# Patient Record
Sex: Female | Born: 1970 | Race: White | Hispanic: No | Smoking: Current every day smoker
Health system: Southern US, Community
[De-identification: ages and names within clinical notes are randomized; demographics above are authoritative.]

## PROBLEM LIST (undated history)

## (undated) DIAGNOSIS — K644 Residual hemorrhoidal skin tags: Secondary | ICD-10-CM

## (undated) DIAGNOSIS — B009 Herpesviral infection, unspecified: Secondary | ICD-10-CM

## (undated) DIAGNOSIS — T7840XA Allergy, unspecified, initial encounter: Secondary | ICD-10-CM

## (undated) DIAGNOSIS — F419 Anxiety disorder, unspecified: Secondary | ICD-10-CM

## (undated) HISTORY — PX: ABLATION: SHX5711

## (undated) HISTORY — DX: Herpesviral infection, unspecified: B00.9

## (undated) HISTORY — DX: Residual hemorrhoidal skin tags: K64.4

## (undated) HISTORY — DX: Allergy, unspecified, initial encounter: T78.40XA

## (undated) HISTORY — DX: Anxiety disorder, unspecified: F41.9

---

## 1999-02-20 ENCOUNTER — Other Ambulatory Visit: Admission: RE | Admit: 1999-02-20 | Discharge: 1999-02-20 | Payer: Self-pay | Admitting: *Deleted

## 2001-01-02 ENCOUNTER — Emergency Department (HOSPITAL_COMMUNITY): Admission: EM | Admit: 2001-01-02 | Discharge: 2001-01-03 | Payer: Self-pay | Admitting: Emergency Medicine

## 2003-03-01 ENCOUNTER — Other Ambulatory Visit: Admission: RE | Admit: 2003-03-01 | Discharge: 2003-03-01 | Payer: Self-pay | Admitting: *Deleted

## 2006-06-04 ENCOUNTER — Ambulatory Visit: Payer: Self-pay | Admitting: Obstetrics & Gynecology

## 2006-06-04 ENCOUNTER — Encounter: Payer: Self-pay | Admitting: Obstetrics & Gynecology

## 2006-06-04 ENCOUNTER — Encounter (INDEPENDENT_AMBULATORY_CARE_PROVIDER_SITE_OTHER): Payer: Self-pay | Admitting: Specialist

## 2007-02-04 ENCOUNTER — Ambulatory Visit: Payer: Self-pay | Admitting: Obstetrics & Gynecology

## 2008-09-08 ENCOUNTER — Inpatient Hospital Stay (HOSPITAL_COMMUNITY): Admission: EM | Admit: 2008-09-08 | Discharge: 2008-09-09 | Payer: Self-pay | Admitting: Emergency Medicine
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
## 2010-07-23 LAB — POCT I-STAT, CHEM 8
Calcium, Ion: 1.08 mmol/L — ABNORMAL LOW (ref 1.12–1.32)
Creatinine, Ser: 0.9 mg/dL (ref 0.4–1.2)
Glucose, Bld: 103 mg/dL — ABNORMAL HIGH (ref 70–99)
Hemoglobin: 16.3 g/dL — ABNORMAL HIGH (ref 12.0–15.0)
Sodium: 139 mEq/L (ref 135–145)
TCO2: 21 mmol/L (ref 0–100)

## 2010-07-23 LAB — CBC
HCT: 41 % (ref 36.0–46.0)
HCT: 45.9 % (ref 36.0–46.0)
Hemoglobin: 13.9 g/dL (ref 12.0–15.0)
MCV: 93.5 fL (ref 78.0–100.0)
MCV: 93.7 fL (ref 78.0–100.0)
Platelets: 195 10*3/uL (ref 150–400)
RBC: 4.38 MIL/uL (ref 3.87–5.11)
WBC: 10.3 10*3/uL (ref 4.0–10.5)
WBC: 8.6 10*3/uL (ref 4.0–10.5)

## 2010-07-23 LAB — URINALYSIS, ROUTINE W REFLEX MICROSCOPIC
Bilirubin Urine: NEGATIVE
Ketones, ur: NEGATIVE mg/dL
Protein, ur: NEGATIVE mg/dL
Urobilinogen, UA: 1 mg/dL (ref 0.0–1.0)

## 2010-07-23 LAB — DIFFERENTIAL
Eosinophils Absolute: 0.1 10*3/uL (ref 0.0–0.7)
Eosinophils Absolute: 0.1 10*3/uL (ref 0.0–0.7)
Eosinophils Relative: 1 % (ref 0–5)
Eosinophils Relative: 1 % (ref 0–5)
Lymphocytes Relative: 24 % (ref 12–46)
Lymphs Abs: 1.8 10*3/uL (ref 0.7–4.0)
Lymphs Abs: 2 10*3/uL (ref 0.7–4.0)
Monocytes Relative: 7 % (ref 3–12)
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
## 2010-08-30 NOTE — Assessment & Plan Note (Signed)
NAME:  Meredith Figueroa, Meredith Figueroa               ACCOUNT NO.:  [id number]   MEDICAL RECORD NO.:  [id number]          PATIENT TYPE:  POB   LOCATION:  [facility]         FACILITY:  [facility]   PHYSICIAN:  Elsie Lincoln, MD      DATE OF BIRTH:  1970-11-08   DATE OF SERVICE:                                  CLINIC NOTE   The patient is a 40 year old G1, P62, 16 female who presents for physical  examination and also has complaints of pain with sex and a white  discharge with a foul odor.  She had been getting care in the area with  another gynecologist and is transferring care to [place].  Her main complaint  today is the discharge and irritation and pain that she has with  intercourse secondary, hopefully, to the discharge.  She is not using  contraceptives other than coitus interruptus.  We talked about wearing  an IUD today, which would help her dysmenorrhea, her menstrual flow, and  also provide better contraception.  She had been on pills in the past  but self-discontinued because she is a smoker and over 33.   PAST MEDICAL HISTORY:  Denies all medical problems.   PAST SURGICAL HISTORY:  Tonsillectomy as a child.   PAST GYNECOLOGICAL HISTORY:  NSVD times 1 nine years ago.  Menarche at  age 3 with 23 day cycles with periods that lasts 5 days.  No bleeding  between periods.  She had an abnormal Pap smear in 1990 but was  secondary to warts and she had a repeat pap.  She has had a history of  warts in the past just as I said before.   FAMILY HISTORY:  Grandmother has diabetes, grandfather has heart  disease.  Grandparents and mother have high blood pressure.  Her father  has throat cancer.  Her paternal grandmother died of ovarian cancer.  She does not know any other of her paternal history.  No history of  blood clots.   SOCIAL HISTORY:  She smokes.  Does not drink alcohol.  She does have six  caffeinated beverages a day.  No history of sexual abuse.   REVIEW OF SYMPTOMS:  Positive for  muscle aches, fatigue, weight gain,  frequent headaches, loss of urine with coughing, sneezing, vaginal odor  and pain with intercourse.  We will be addressing the painful  intercourse and yeast infection today.   PHYSICAL EXAMINATION:  Pulse 75, blood pressure 107/77, weight 207  pounds.  GENERAL:  Well-developed, well-nourished, in no apparent distress.  HEENT: Normocephalic, atraumatic.  Neck no masses.  LUNGS:  Clear to auscultation bilaterally.  HEART:  Regular rate and rhythm.  BREASTS:  Symmetric, no masses, no nipple discharge.  ABDOMEN:  Soft nontender, nondistended.  No hernia, No organomegaly.  Patient has multiple skin lesions on her trunk and one is of particular  concern to the right of the umbilicus.  This needs a dermatology  referral.  GENITALIA:  Vagina slightly injected with discharge consistent with  yeast.  Uterus anteverted, non-tender.  Adnexa: No masses, non-tender.  Urethra:  Well supported, nontender.  Bladder:  Non-tender.  Perineum  intact.  EXTREMITIES: No edema.  ASSESSMENT/PLAN:  A 40 year old female for gynecological examination.  1. Pap smear and cultures.  2. Wet prep showed yeast.  3. Prescribe  Diflucan.  4. Dermatology referral.  5. Patient to get a primary care Andon Villard for all other primary care      needs.  6. Patient to come back if still having discharge symptoms or would      like to try the IUD.           ______________________________  Elsie Lincoln, MD     KL/MEDQ  D:  06/04/2006  T:  06/04/2006  Job:  [id number]

## 2011-03-22 ENCOUNTER — Ambulatory Visit (INDEPENDENT_AMBULATORY_CARE_PROVIDER_SITE_OTHER): Payer: Managed Care, Other (non HMO)

## 2011-03-22 DIAGNOSIS — Z23 Encounter for immunization: Secondary | ICD-10-CM

## 2011-07-12 ENCOUNTER — Ambulatory Visit: Payer: Managed Care, Other (non HMO)

## 2011-08-13 HISTORY — PX: TUBAL LIGATION: SHX77

## 2012-01-08 ENCOUNTER — Ambulatory Visit (INDEPENDENT_AMBULATORY_CARE_PROVIDER_SITE_OTHER): Payer: Managed Care, Other (non HMO) | Admitting: Physician Assistant

## 2012-01-08 VITALS — BP 116/62 | HR 83 | Temp 98.7°F | Resp 18 | Ht 69.5 in | Wt 203.2 lb

## 2012-01-08 DIAGNOSIS — B009 Herpesviral infection, unspecified: Secondary | ICD-10-CM

## 2012-01-08 DIAGNOSIS — B001 Herpesviral vesicular dermatitis: Secondary | ICD-10-CM

## 2012-01-08 MED ORDER — CHLORHEXIDINE GLUCONATE 0.12 % MT SOLN: 15.0000 mL | Freq: Two times a day (BID) | OROMUCOSAL | Status: DC

## 2012-01-08 MED ORDER — VALACYCLOVIR HCL 1 G PO TABS: ORAL_TABLET | ORAL | Status: DC

## 2012-01-08 NOTE — Patient Instructions (Signed)
Keep the lips moisturized (I like the Clinique SuperBalm) to allow the lesions to heal and for your comfort.  You may find that taking ibuprofen also helps, since it reduces inflammation. Good dental care can prevent secondary infection (brush 2-3 times daily and floss at least once daily).

## 2012-01-08 NOTE — Progress Notes (Signed)
  Subjective:    Patient ID: Meredith Figueroa, female    DOB: 1970-07-16, 41 y.o.   MRN: [id number]  HPI This 41 y.o. female presents for evaluation of a cluster of fever blisters that began 01/03/2012.  Didn't begin treatment with Abreva until 01/04/2012, but hasn't had improvement this time, and has gotten larger than usual.  Lesions are triggered by stress and intense heat ("like at the beach.").  Lots of work stress this week.  Describes pain from the lip into the gum and teeth adjacent.  Review of Systems  No recent URI-type illness.  Past Medical History  Diagnosis Date  . HSV infection     twice annually; mouth  . Allergy     Past Surgical History  Procedure Date  . Tubal ligation 08/2011    Prior to Admission medications   Not on File    Not on File  History   Social History  . Marital Status: Married    Spouse Name: Denija Vantassel    Number of Children: 1  . Years of Education: 14   Occupational History  . Marine scientist    Social History Main Topics  . Smoking status: Current Every Day Smoker -- 1.0 packs/day    Types: Cigarettes    Start date: 01/07/1986  . Smokeless tobacco: Never Used  . Alcohol Use: Yes     occasional  . Drug Use: No  . Sexually Active: Yes -- Female partner(s)    Birth Control/ Protection: Surgical   Other Topics Concern  . Not on file   Social History Narrative  . No narrative on file    Family History  Problem Relation Age of Onset  . Diabetes Mother   . Arthritis Mother   . Heart disease Mother   . Cancer Father        Objective:   Physical Exam  Vitals reviewed. Constitutional: She is oriented to person, place, and time. Vital signs are normal. She appears well-developed and well-nourished. She is active and cooperative. No distress.  HENT:  Head: Normocephalic and atraumatic.  Mouth/Throat: Uvula is midline. Abnormal dentition: heavy plaque build up, mild gingival erythema.    Eyes: Conjunctivae normal are  normal.  Neck: Normal range of motion and phonation normal. Neck supple. No thyromegaly present.  Cardiovascular: Normal rate, regular rhythm and normal heart sounds.   Pulmonary/Chest: Effort normal and breath sounds normal.  Lymphadenopathy:    She has no cervical adenopathy.  Neurological: She is alert and oriented to person, place, and time.  Skin: Skin is warm and dry.  Psychiatric: She has a normal mood and affect.          Assessment & Plan:

## 2012-05-14 ENCOUNTER — Ambulatory Visit (INDEPENDENT_AMBULATORY_CARE_PROVIDER_SITE_OTHER): Payer: Managed Care, Other (non HMO) | Admitting: Family Medicine

## 2012-05-14 VITALS — BP 130/77 | HR 66 | Temp 98.1°F | Resp 16 | Ht 69.5 in | Wt 202.0 lb

## 2012-05-14 DIAGNOSIS — Z113 Encounter for screening for infections with a predominantly sexual mode of transmission: Secondary | ICD-10-CM

## 2012-05-14 DIAGNOSIS — R300 Dysuria: Secondary | ICD-10-CM

## 2012-05-14 LAB — POCT URINALYSIS DIPSTICK
Ketones, UA: NEGATIVE
Leukocytes, UA: NEGATIVE
Protein, UA: NEGATIVE
pH, UA: 7

## 2012-05-14 LAB — POCT UA - MICROSCOPIC ONLY
Crystals, Ur, HPF, POC: NEGATIVE
Mucus, UA: NEGATIVE
Yeast, UA: NEGATIVE

## 2012-05-14 NOTE — Patient Instructions (Addendum)
I will be in touch with the rest of your labs. Let us know if you continue to be concerned about any bumps and we can check again

## 2012-05-14 NOTE — Progress Notes (Signed)
[facility] [address], [place] [address] [phone]  Date:  05/14/2012   Name:  Meredith Figueroa   DOB:  Jul 27, 1970   MRN:  [id number]  PCP:  No primary provider on file.    Chief Complaint: Exposure to STD   History of Present Illness:  Meredith Figueroa is a 42 y.o. very pleasant female patient who presents with the following:  She was diagnosed with warts in her genital area as a teen. She has recently noted non- tender bumps in her vaginal area and wanted to be sure everything was ok.   Othewise she is feeling well.  No current dysuria, but she noted UTI symptoms as of late.  She use cipro a few weeks ago, but still notes that her urine seems cloudy.    She had an endometrial ablation and does not get menses.    Otherwise she is generally healthy.  She does tend to get cold sore, but no history of genital HSV  Patient Active Problem List  Diagnosis  . HSV infection    Past Medical History  Diagnosis Date  . HSV infection     twice annually; mouth  . Allergy     Past Surgical History  Procedure Date  . Tubal ligation 08/2011    History  Substance Use Topics  . Smoking status: Current Every Day Smoker -- 1.0 packs/day    Types: Cigarettes    Start date: 01/07/1986  . Smokeless tobacco: Never Used  . Alcohol Use: Yes     Comment: occasional    Family History  Problem Relation Age of Onset  . Diabetes Mother   . Arthritis Mother   . Heart disease Mother   . Cancer Father     No Known Allergies  Medication list has been reviewed and updated.  Current Outpatient Prescriptions on File Prior to Visit  Medication Sig Dispense Refill  . valACYclovir (VALTREX) 1000 MG tablet Take 2 tablets by mouth every 12 hours x 2 doses, prn fever blister  30 tablet  1    Review of Systems:  As per HPI- otherwise negative.   Physical Examination: Filed Vitals:   05/14/12 1100  BP: 130/77  Pulse: 66  Temp: 98.1 F (36.7 C)  Resp: 16    Filed Vitals:   05/14/12 1100  Height: 5' 9.5" (1.765 m)  Weight: 202 lb (91.627 kg)   Body mass index is 29.40 kg/(m^2). Ideal Body Weight: Weight in (lb) to have BMI = 25: 171.4   GEN: WDWN, NAD, Non-toxic, A & O x 3 HEENT: Atraumatic, Normocephalic. Neck supple. No masses, No LAD. Bilateral TM wnl, oropharynx normal.  PEERL,EOMI.   Ears and Nose: No external deformity. CV: RRR, No M/G/R. No JVD. No thrill. No extra heart sounds. PULM: CTA B, no wheezes, crackles, rhonchi. No retractions. No resp. distress. No accessory muscle use. EXTR: No c/c/e NEURO Normal gait.  PSYCH: Normally interactive. Conversant. Not depressed or anxious appearing.  Calm demeanor.  GU: no abnormality noted, normal skin and remnant of hymenal ring.  I do not see any apparent warts.  Did genprobe per her request  Results for orders placed in visit on 05/14/12  POCT UA - MICROSCOPIC ONLY      Component Value Range   WBC, Ur, HPF, POC 0-1     RBC, urine, microscopic 1-3     Bacteria, U Microscopic neg     Mucus, UA neg  Epithelial cells, urine per micros 3-6     Crystals, Ur, HPF, POC neg     Casts, Ur, LPF, POC neg     Yeast, UA neg    POCT URINALYSIS DIPSTICK      Component Value Range   Color, UA yellow     Clarity, UA clear     Glucose, UA neg     Bilirubin, UA neg     Ketones, UA neg     Spec Grav, UA 1.010     Blood, UA small     pH, UA 7.0     Protein, UA neg     Urobilinogen, UA 0.2     Nitrite, UA neg     Leukocytes, UA Negative      Assessment and Plan: 1. Routine screening for STI (sexually transmitted infection)  GC/chlamydia probe amp, genital  2. Dysuria  POCT UA - Microscopic Only, POCT urinalysis dipstick, Urine culture   Await labs as above.  reassurance that I do not see any apparent warts, but she will let us know if she continues to notice anything out of the ordinary   Inice Sanluis, MD

## 2012-05-15 LAB — GC/CHLAMYDIA PROBE AMP
CT Probe RNA: NEGATIVE
GC Probe RNA: NEGATIVE

## 2012-11-17 ENCOUNTER — Ambulatory Visit (INDEPENDENT_AMBULATORY_CARE_PROVIDER_SITE_OTHER): Payer: Managed Care, Other (non HMO) | Admitting: Family Medicine

## 2012-11-17 VITALS — BP 120/72 | HR 86 | Temp 98.0°F | Resp 18 | Ht 69.5 in | Wt 204.0 lb

## 2012-11-17 DIAGNOSIS — R198 Other specified symptoms and signs involving the digestive system and abdomen: Secondary | ICD-10-CM

## 2012-11-17 DIAGNOSIS — F411 Generalized anxiety disorder: Secondary | ICD-10-CM

## 2012-11-17 DIAGNOSIS — B009 Herpesviral infection, unspecified: Secondary | ICD-10-CM

## 2012-11-17 DIAGNOSIS — R3989 Other symptoms and signs involving the genitourinary system: Secondary | ICD-10-CM

## 2012-11-17 MED ORDER — ALPRAZOLAM 0.5 MG PO TABS: 0.5000 mg | ORAL_TABLET | Freq: Every evening | ORAL | Status: DC | PRN

## 2012-11-17 MED ORDER — DOXYCYCLINE HYCLATE 100 MG PO TABS: 100.0000 mg | ORAL_TABLET | Freq: Two times a day (BID) | ORAL | Status: DC

## 2012-11-17 NOTE — Patient Instructions (Addendum)
You can take the valtrex twice per day for next 1 week - until results of tests today. Apply warm compresses or soaks 3-4 times per day., start antibiotic 2 times per day, recheck in next 2-3 days if not much improved. Return to the clinic or go to the nearest emergency room if any of your symptoms worsen or new symptoms occur.  Xanax if needed up to twice per day, but follow up to discuss your symptoms further with your primary provider, or here if frequent use needed.  Let us know if you would like a name of a counselor/therapist.

## 2012-11-17 NOTE — Progress Notes (Signed)
Subjective:    Patient ID: Meredith Figueroa, female    DOB: 08/13/1970, 41 y.o.   MRN: [id number]  HPI Meredith Figueroa is a 42 y.o. female  Cold sore - noticed on lower left lip Friday am - 5 days ago. Did start Valtrex 2 grams and repeat in 12 hours. Also used Abbreva. More stress recently. Flairs once per year. Going through separation.  Noticed bump in genital area this am - concerned may have spread. Sore area. No vaginal discharge. No new sexual partners. No tx. No prior similar sx's.   Novosure and tubal ligation last year- no further menstrual bleeding.   Going through separation  - past 1 month. Used Effexor in past, but not needed recently. Anxious at times, and xanax used for sleep prior.    Review of Systems  Constitutional: Negative for fever and chills.  Genitourinary: Positive for genital sores. Negative for dysuria, urgency, hematuria, vaginal discharge and difficulty urinating.  Psychiatric/Behavioral: Negative for suicidal ideas.       Objective:   Physical Exam  Constitutional: She appears well-developed and well-nourished. No distress.  HENT:  Head: Normocephalic and atraumatic.  Mouth/Throat: Uvula is midline.    Pulmonary/Chest: Effort normal.  Genitourinary: Vagina normal.    There is lesion on the left labia. No tenderness around the vagina. No vaginal discharge found.  Psychiatric: Her speech is normal and behavior is normal. Judgment and thought content normal. Cognition and memory are normal. She expresses no suicidal ideation.  Tearful at time during exam.  Flat affect.           Assessment & Plan:  Meredith Figueroa is a 42 y.o. female HSV-1 (herpes simplex virus 1) infection - Plan: Herpes simplex virus culture, HSV(herpes simplex vrs) 1+2 ab-IgG.  Genital sore - Plan: Herpes simplex virus culture, HSV(herpes simplex vrs) 1+2 ab-IgG, doxycycline (VIBRA-TABS) 100 MG tablet -  Appears more cystic vs early abcess, less likely HSV, but with current  flair of HSV 1 will cover with valtrex 1 gram BID for 7 days until cx and HSV IGG results return.  Additionally cover for infection with doxycycline, warm compresses, RTC for recheck in next 2-3 days.   Anxiety state, unspecified - Plan: ALPRAZolam (XANAX) 0.5 MG tablet.  Adjustment disorder with current separation. Will rx temporary xanax, but consider restart effexor if persistent sx''s.   rtc precautions.   Meds ordered this encounter  Medications  . ALPRAZolam (XANAX) 0.5 MG tablet    Sig: Take 1 tablet (0.5 mg total) by mouth at bedtime as needed for sleep.    Dispense:  15 tablet    Refill:  0  . doxycycline (VIBRA-TABS) 100 MG tablet    Sig: Take 1 tablet (100 mg total) by mouth 2 (two) times daily.    Dispense:  20 tablet    Refill:  0   Patient Instructions  You can take the valtrex twice per day for next 1 week - until results of tests today. Apply warm compresses or soaks 3-4 times per day., start antibiotic 2 times per day, recheck in next 2-3 days if not much improved. Return to the clinic or go to the nearest emergency room if any of your symptoms worsen or new symptoms occur.  Xanax if needed up to twice per day, but follow up to discuss your symptoms further with your primary provider, or here if frequent use needed.  Let us know if you would like a name of a counselor/therapist.

## 2012-11-18 LAB — HSV(HERPES SIMPLEX VRS) I + II AB-IGG
HSV 1 Glycoprotein G Ab, IgG: 7.77 IV — ABNORMAL HIGH
HSV 2 Glycoprotein G Ab, IgG: 0.59 IV

## 2012-11-23 ENCOUNTER — Ambulatory Visit (INDEPENDENT_AMBULATORY_CARE_PROVIDER_SITE_OTHER): Payer: Managed Care, Other (non HMO) | Admitting: Family Medicine

## 2012-11-23 VITALS — BP 128/78 | HR 73 | Temp 98.5°F | Resp 18 | Ht 68.0 in | Wt 205.6 lb

## 2012-11-23 DIAGNOSIS — R3989 Other symptoms and signs involving the genitourinary system: Secondary | ICD-10-CM

## 2012-11-23 DIAGNOSIS — R198 Other specified symptoms and signs involving the digestive system and abdomen: Secondary | ICD-10-CM

## 2012-11-23 MED ORDER — DOXYCYCLINE HYCLATE 100 MG PO TABS: 100.0000 mg | ORAL_TABLET | Freq: Two times a day (BID) | ORAL | Status: DC

## 2012-11-23 NOTE — Progress Notes (Signed)
Sx:  Patient  Was seen last week for left labial boil which spontaneously ruptured on the day she was seen and started on doxy.  The abscess has started to reaccumulate with pain worsening. 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  Objective:  NAD  1/2 cm left upper subcutaneous labial nodule:  Firm, minimally tender, well-circumscribed without overlying erythema  Results for orders placed in visit on 11/17/12  HERPES SIMPLEX VIRUS CULTURE      Result Value Range   Organism ID, Bacteria No Herpes Simplex Virus detected.    HSV(HERPES SIMPLEX VRS) I + II AB-IGG      Result Value Range   HSV 1 Glycoprotein G Ab, IgG 7.77 (*)    HSV 2 Glycoprotein G Ab, IgG 0.59     Assessment:  Resolving labial abscess  Plan:  Hot compresses and continued doxycycline.  Signed, Elvina Sidle, MD

## 2013-04-27 ENCOUNTER — Telehealth: Payer: Self-pay

## 2013-04-27 DIAGNOSIS — B001 Herpesviral vesicular dermatitis: Secondary | ICD-10-CM

## 2013-04-27 NOTE — Telephone Encounter (Signed)
PT REQUESTING REFILL ON VALTREX   BEST PHONE FOR PT IS CELL NUMBER  [phone]  [facility]ARMACY [facility] [address]

## 2013-04-28 MED ORDER — VALACYCLOVIR HCL 1 G PO TABS: ORAL_TABLET | ORAL | Status: DC

## 2013-04-28 NOTE — Telephone Encounter (Signed)
Advised pt refill has been sent into pharmacy.

## 2013-06-29 ENCOUNTER — Ambulatory Visit (INDEPENDENT_AMBULATORY_CARE_PROVIDER_SITE_OTHER): Payer: Managed Care, Other (non HMO) | Admitting: Family Medicine

## 2013-06-29 VITALS — BP 110/80 | HR 95 | Temp 99.9°F | Resp 16 | Ht 69.5 in | Wt 208.0 lb

## 2013-06-29 DIAGNOSIS — R509 Fever, unspecified: Secondary | ICD-10-CM

## 2013-06-29 DIAGNOSIS — N10 Acute tubulo-interstitial nephritis: Secondary | ICD-10-CM

## 2013-06-29 DIAGNOSIS — R6889 Other general symptoms and signs: Secondary | ICD-10-CM

## 2013-06-29 DIAGNOSIS — N390 Urinary tract infection, site not specified: Secondary | ICD-10-CM

## 2013-06-29 LAB — COMPREHENSIVE METABOLIC PANEL
ALT: 10 U/L (ref 0–35)
Albumin: 3.7 g/dL (ref 3.5–5.2)
CO2: 24 mEq/L (ref 19–32)
Calcium: 8.7 mg/dL (ref 8.4–10.5)
Chloride: 104 mEq/L (ref 96–112)
Glucose, Bld: 124 mg/dL — ABNORMAL HIGH (ref 70–99)
Potassium: 3.4 mEq/L — ABNORMAL LOW (ref 3.5–5.3)
Sodium: 135 mEq/L (ref 135–145)
Total Bilirubin: 1 mg/dL (ref 0.2–1.2)
Total Protein: 6.5 g/dL (ref 6.0–8.3)

## 2013-06-29 LAB — POCT CBC
Granulocyte percent: 81.3 %G — AB (ref 37–80)
HCT, POC: 43.5 % (ref 37.7–47.9)
Hemoglobin: 13.9 g/dL (ref 12.2–16.2)
Lymph, poc: 1.7 (ref 0.6–3.4)
MCH, POC: 31.4 pg — AB (ref 27–31.2)
MCHC: 32 g/dL (ref 31.8–35.4)
MCV: 98.2 fL — AB (ref 80–97)
MID (cbc): 1.6 — AB (ref 0–0.9)
MPV: 13.9 fL (ref 0–99.8)
POC Granulocyte: 14.3 — AB (ref 2–6.9)
POC LYMPH PERCENT: 9.6 % — AB (ref 10–50)
POC MID %: 9.1 % (ref 0–12)
Platelet Count, POC: 180 10*3/uL (ref 142–424)
RBC: 4.43 M/uL (ref 4.04–5.48)
RDW, POC: 12.6 %
WBC: 17.6 10*3/uL — AB (ref 4.6–10.2)

## 2013-06-29 LAB — COMPREHENSIVE METABOLIC PANEL WITH GFR
AST: 9 U/L (ref 0–37)
Alkaline Phosphatase: 91 U/L (ref 39–117)
BUN: 6 mg/dL (ref 6–23)
Creat: 0.69 mg/dL (ref 0.50–1.10)

## 2013-06-29 LAB — POCT INFLUENZA A/B
Influenza A, POC: NEGATIVE
Influenza B, POC: NEGATIVE

## 2013-06-29 MED ORDER — CEFTRIAXONE SODIUM 1 G IJ SOLR
1.0000 g | Freq: Once | INTRAMUSCULAR | Status: AC
  Administered 2013-06-29: 1 g via INTRAMUSCULAR

## 2013-06-29 MED ORDER — CIPROFLOXACIN HCL 500 MG PO TABS: 500.0000 mg | ORAL_TABLET | Freq: Two times a day (BID) | ORAL | Status: DC

## 2013-06-29 NOTE — Progress Notes (Addendum)
Chief Complaint:  Chief Complaint  Patient presents with  . Back Pain    x 3 day pt is taking antibiotic for a uti    HPI: Meredith Figueroa is a 43 y.o. female who is here for  3 day history of chills, msk aches, and fever. She originally thought she had a UTI since she gets them frequently, sxs started out as dark urine, increase frequency and some back pain so she called her OB/Gyn who prescribed her macrobid  Based on a prior urine culture she had done back in November 2014. She took the antibiotics for 2 days and continued to have subjective fevers, chills, and bialteral lower back pain. Her urinary symptoms have decreased but not the fevers or chills. She did not go in for a urine analysis or urine culture and has been on the macrobid now for the last 2 days. She has no nausea, has had minimal pelvic discomfort , and also low back pain, pooor appetite. She has had a history of UTIs and prior history of what sounds like pyelonephritis. She has been taking ibuprofen for her fevers and back pain. Denies any history of kidney stones. Denies vomiting, she feels quesy but not nauseated. Denies CP, SOB, wheezing or palpitations. Denies diabetes.   Additionally she has sinus pressure, HA and some minimal nonproductive  coughing, last week she was bringing up green and orange mucus in the back of her throat but that has resolved. She denies  ear pain, but does  feel it is muffled due to sinus issues. Currently no drainage. She is a smoker.Today she states she now has fever blisters which comes out when she is stressed or sick.    Upon chart review of old images  In the EMR there was an incidental finding on a CT abd/pelvis of a liver hemangioma. She currently denies any RUQ abd pain, weight loss, night sweats, jaundice.   Past Medical History  Diagnosis Date  . HSV infection     twice annually; mouth  . Allergy    Past Surgical History  Procedure Laterality Date  . Tubal ligation  08/2011     History   Social History  . Marital Status: Married    Spouse Name: Meredith Figueroa    Number of Children: 1  . Years of Education: 14   Occupational History  . Marine scientistinsurance underwriter    Social History Main Topics  . Smoking status: Current Every Day Smoker -- 1.00 packs/day    Types: Cigarettes    Start date: 01/07/1986  . Smokeless tobacco: Never Used  . Alcohol Use: Yes     Comment: occasional  . Drug Use: No  . Sexual Activity: Yes    Partners: Male    Birth Control/ Protection: Surgical   Other Topics Concern  . None   Social History Narrative  . None   Family History  Problem Relation Age of Onset  . Diabetes Mother   . Arthritis Mother   . Heart disease Mother   . Cancer Father    No Known Allergies Prior to Admission medications   Medication Sig Start Date End Date Taking? Authorizing Provider  ALPRAZolam Prudy Feeler(XANAX) 0.5 MG tablet Take 1 tablet (0.5 mg total) by mouth at bedtime as needed for sleep. 11/17/12  Yes Shade FloodJeffrey R Greene, MD  nitrofurantoin, macrocrystal-monohydrate, (MACROBID) 100 MG capsule Take 100 mg by mouth 2 (two) times daily.   Yes Historical Provider, MD  doxycycline (VIBRA-TABS)  100 MG tablet Take 1 tablet (100 mg total) by mouth 2 (two) times daily. 11/23/12   Elvina Sidle, MD  valACYclovir (VALTREX) 1000 MG tablet Take 2 tablets by mouth every 12 hours x 2 doses, prn fever blister 04/28/13   Chelle S Jeffery, PA-C     ROS: The patient denies  night sweats, unintentional weight loss, chest pain, palpitations, wheezing, dyspnea on exertion, nausea, vomiting, dysuria, hematuria, melena, numbness, weakness, or tingling.   All other systems have been reviewed and were otherwise negative with the exception of those mentioned in the HPI and as above.    PHYSICAL EXAM: Filed Vitals:   06/29/13 1343  BP: 110/80  Pulse: 95  Temp: 99.9 F (37.7 C)  Resp: 16  Spo2 96% Filed Vitals:   06/29/13 1343  Height: 5' 9.5" (1.765 m)  Weight: 208 lb  (94.348 kg)   Body mass index is 30.29 kg/(m^2).  General: Alert, mild distress, apears tired HEENT:  Normocephalic, atraumatic, oropharynx patent. EOMI, PERRLA. Tm left injected, + minimal  sinus tenderness, left TM is slight red but no e/o infection Cardiovascular:  Regular rate and rhythm, no rubs murmurs or gallops.  No Carotid bruits, radial pulse intact. No pedal edema.  Respiratory: Clear to auscultation bilaterally.  No wheezes, rales, or rhonchi.  No cyanosis, no use of accessory musculature GI: No organomegaly, abdomen is soft and non-tender, positive bowel sounds.  No masses. Skin: No rashes. Neurologic: Facial musculature symmetric. Psychiatric: Patient is appropriate throughout our interaction. Lymphatic: No cervical lymphadenopathy Musculoskeletal: Gait intact. Bilateral low back tenderness +/- CVA tenderness   LABS: Results for orders placed in visit on 06/29/13  POCT CBC      Result Value Ref Range   WBC 17.6 (*) 4.6 - 10.2 K/uL   Lymph, poc 1.7  0.6 - 3.4   POC LYMPH PERCENT 9.6 (*) 10 - 50 %L   MID (cbc) 1.6 (*) 0 - 0.9   POC MID % 9.1  0 - 12 %M   POC Granulocyte 14.3 (*) 2 - 6.9   Granulocyte percent 81.3 (*) 37 - 80 %G   RBC 4.43  4.04 - 5.48 M/uL   Hemoglobin 13.9  12.2 - 16.2 g/dL   HCT, POC 16.1  09.6 - 47.9 %   MCV 98.2 (*) 80 - 97 fL   MCH, POC 31.4 (*) 27 - 31.2 pg   MCHC 32.0  31.8 - 35.4 g/dL   RDW, POC 04.5     Platelet Count, POC 180  142 - 424 K/uL   MPV 13.9  0 - 99.8 fL  POCT INFLUENZA A/B      Result Value Ref Range   Influenza A, POC Negative     Influenza B, POC Negative       EKG/XRAY:   Primary read interpreted by [facility] Conley [facility]s at [facility].   ASSESSMENT/PLAN: Encounter Diagnoses  Name Primary?  . Flu-like symptoms Yes  . UTI (urinary tract infection)   . Fever   . Acute pyelonephritis    Meredith Figueroa is a pleasant non-diabetic 43 y/o female with what appears to be UTI sxs/pyelonephritis with some mild sinus congestion and a  leukocytosis of 17.6. She was put on macrobid by her ob/gyn for possible UTI  For the last  2 days and sxs are somewhat better but she has had worsening fevers, chills and back pain. She did not go in to get checked for her urinary sxs, this was based on prior urine  studies.  I called her ob/gyn office and last urine c/s in 02/2013 grew out   100,0000 staph species coag   Negative Sensitive to macrobid and cefazolin, cipro and Bactrim. Her treatment for pyelonephritis will cover for her sinus issues as well  She was given Rocephin 1  Gram IM  in office Rx Cipro 50 mg BID x 10 days CMP pending Urine culture pending, I did not get an UA since she already has been on macrobid for the last 2 days. If anything grows out or if there is resistance then it will show up on the urine c/s. Since her white count is so high I am asking her to return in 1 day to recheck CBC,future  order has already been  placed. She will be fast tracked for this recheck.  Push fluids.  Go to Er prn     Gross sideeffects, risk and benefits, and alternatives of medications d/w patient. Patient is aware that all medications have potential sideeffects and we are unable to predict every sideeffect or drug-drug interaction that may occur.  Jettie Mannor PHUONG, DO 06/29/2013 4:36 PM    06/30/2013- I reviewed her labs and will go ahead and add a HbA1c as well to her blood draw  since her glucose was high, she states that she was nondiabetic and also she has not really eaten all day but her glucose was 124. Potassium was slighlty low but will ask her to push fluids and eat potassium rich foods ie coconut water.

## 2013-06-29 NOTE — Patient Instructions (Signed)

## 2013-06-30 ENCOUNTER — Other Ambulatory Visit: Payer: Self-pay | Admitting: Family Medicine

## 2013-06-30 ENCOUNTER — Ambulatory Visit (INDEPENDENT_AMBULATORY_CARE_PROVIDER_SITE_OTHER): Payer: Managed Care, Other (non HMO) | Admitting: Family Medicine

## 2013-06-30 VITALS — BP 140/68 | HR 88 | Temp 99.0°F | Resp 18 | Ht 68.0 in | Wt 205.0 lb

## 2013-06-30 DIAGNOSIS — R509 Fever, unspecified: Secondary | ICD-10-CM | POA: Diagnosis not present

## 2013-06-30 DIAGNOSIS — R739 Hyperglycemia, unspecified: Secondary | ICD-10-CM

## 2013-06-30 DIAGNOSIS — N10 Acute tubulo-interstitial nephritis: Secondary | ICD-10-CM

## 2013-06-30 DIAGNOSIS — R7309 Other abnormal glucose: Secondary | ICD-10-CM | POA: Diagnosis not present

## 2013-06-30 LAB — POCT CBC
Granulocyte percent: 78.8 %G (ref 37–80)
HCT, POC: 44 % (ref 37.7–47.9)
Hemoglobin: 14.1 g/dL (ref 12.2–16.2)
Lymph, poc: 2.2 (ref 0.6–3.4)
MCH, POC: 31.5 pg — AB (ref 27–31.2)
MCHC: 32 g/dL (ref 31.8–35.4)
MCV: 98.3 fL — AB (ref 80–97)
MID (cbc): 1.2 — AB (ref 0–0.9)
MPV: 12.6 fL (ref 0–99.8)
POC Granulocyte: 12.5 — AB (ref 2–6.9)
POC LYMPH PERCENT: 13.7 %L (ref 10–50)
POC MID %: 7.5 %M (ref 0–12)
Platelet Count, POC: 197 10*3/uL (ref 142–424)
RBC: 4.48 M/uL (ref 4.04–5.48)
RDW, POC: 13.2 %
WBC: 15.9 10*3/uL — AB (ref 4.6–10.2)

## 2013-06-30 LAB — POCT URINALYSIS DIPSTICK
GLUCOSE UA: NEGATIVE
KETONES UA: NEGATIVE
Leukocytes, UA: NEGATIVE
Nitrite, UA: NEGATIVE
Protein, UA: 100
Urobilinogen, UA: 4
pH, UA: 6

## 2013-06-30 LAB — URINE CULTURE: Colony Count: 3000

## 2013-06-30 LAB — POCT UA - MICROSCOPIC ONLY
Casts, Ur, LPF, POC: NEGATIVE
Crystals, Ur, HPF, POC: NEGATIVE
WBC, UR, HPF, POC: NEGATIVE
Yeast, UA: NEGATIVE

## 2013-06-30 LAB — POCT URINE PREGNANCY: Preg Test, Ur: NEGATIVE

## 2013-06-30 LAB — POCT GLYCOSYLATED HEMOGLOBIN (HGB A1C): Hemoglobin A1C: 5.3

## 2013-06-30 MED ORDER — CEFTRIAXONE SODIUM 1 G IJ SOLR
1.0000 g | Freq: Once | INTRAMUSCULAR | Status: AC
  Administered 2013-06-30: 1 g via INTRAMUSCULAR

## 2013-06-30 NOTE — Progress Notes (Addendum)
[facility] [address], [place] [id number] [phone]  Date:  06/30/2013   Name:  Meredith Figueroa   DOB:  [birth year]   MRN:  [id number]  PCP:  Garlan FillersPATERSON,DANIEL G, MD    Chief Complaint: Follow-up   History of Present Illness:  Meredith Figueroa is a 43 y.o. very pleasant female patient who presents with the following:  Seen yesterday by Dr. Conley RollsLe with flu- like sx and pyelonephritis.  Yesterday noted to have a wbc count if 17.6k, tx with rocephin and cipro. Her shaking chills are resolved.  She does "not feel great," but is better.  She still has some aches.  Her urine seems more clear. She was not really aware of UTI symptoms, does not have any dysuria.  Last night when she wiped she saw some "light pink" on the TP. She was not sure if this might be vaginal- LMP was about 2 years ago prior to her ablation.  However yesterday she also had a pap- this is a possible cause of her bleeding.      She was already on macrobid when she was seen yesterday per her OBG; however she had not yet had a UA or urine culture  Her sinus sx are better today.  Left ear still feels congested but is better.   She is drinking plenty of fluids, no N/V.  She is not eating much due to poor appetite.  She has not measured a fever at home.   Patient Active Problem List   Diagnosis Date Noted  . HSV infection     Past Medical History  Diagnosis Date  . HSV infection     twice annually; mouth  . Allergy     Past Surgical History  Procedure Laterality Date  . Tubal ligation  08/2011    History  Substance Use Topics  . Smoking status: Current Every Day Smoker -- 1.00 packs/day    Types: Cigarettes    Start date: 01/07/1986  . Smokeless tobacco: Never Used  . Alcohol Use: Yes     Comment: occasional    Family History  Problem Relation Age of Onset  . Diabetes Mother   . Arthritis Mother   . Heart disease Mother   . Cancer Father     No Known Allergies  Medication list  has been reviewed and updated.  Current Outpatient Prescriptions on File Prior to Visit  Medication Sig Dispense Refill  . ALPRAZolam (XANAX) 0.5 MG tablet Take 1 tablet (0.5 mg total) by mouth at bedtime as needed for sleep.  15 tablet  0  . ciprofloxacin (CIPRO) 500 MG tablet Take 1 tablet (500 mg total) by mouth 2 (two) times daily.  20 tablet  0  . valACYclovir (VALTREX) 1000 MG tablet Take 2 tablets by mouth every 12 hours x 2 doses, prn fever blister  30 tablet  1   No current facility-administered medications on file prior to visit.    Review of Systems:  As per HPI- otherwise negative.   Physical Examination: Filed Vitals:   06/30/13 1001  BP: 140/68  Pulse: 88  Temp: 99 F (37.2 C)  Resp: 18   Filed Vitals:   06/30/13 1001  Height: 5\' 8"  (1.727 m)  Weight: 205 lb (92.987 kg)   Body mass index is 31.18 kg/(m^2). Ideal Body Weight: Weight in (lb) to have BMI = 25: 164.1  GEN: WDWN, NAD, Non-toxic, A & O x 3, overweight, looks well  HEENT: Atraumatic, Normocephalic. Neck supple. No masses, No LAD.  Bilateral TM wnl, oropharynx normal.  PEERL,EOMI.   Ears and Nose: No external deformity. CV: RRR, No M/G/R. No JVD. No thrill. No extra heart sounds. PULM: CTA B, no wheezes, crackles, rhonchi. No retractions. No resp. distress. No accessory muscle use. ABD: S, NT, ND, +BS. No rebound. No HSM. EXTR: No c/c/e NEURO Normal gait.  PSYCH: Normally interactive. Conversant. Not depressed or anxious appearing.  Calm demeanor.  Mild tenderness over bladder- otherwise abdomen is negative Gu: no apparent sorce of bleeding from the cervix or vagina. Normal exam. No lesions, discharge, CMT, or adnexal tenderness or masses  Results for orders placed in visit on 06/30/13  POCT CBC      Result Value Ref Range   WBC 15.9 (*) 4.6 - 10.2 K/uL   Lymph, poc 2.2  0.6 - 3.4   POC LYMPH PERCENT 13.7  10 - 50 %L   MID (cbc) 1.2 (*) 0 - 0.9   POC MID % 7.5  0 - 12 %M   POC Granulocyte  12.5 (*) 2 - 6.9   Granulocyte percent 78.8  37 - 80 %G   RBC 4.48  4.04 - 5.48 M/uL   Hemoglobin 14.1  12.2 - 16.2 g/dL   HCT, POC 16.1  09.6 - 47.9 %   MCV 98.3 (*) 80 - 97 fL   MCH, POC 31.5 (*) 27 - 31.2 pg   MCHC 32.0  31.8 - 35.4 g/dL   RDW, POC 04.5     Platelet Count, POC 197  142 - 424 K/uL   MPV 12.6  0 - 99.8 fL  POCT GLYCOSYLATED HEMOGLOBIN (HGB A1C)      Result Value Ref Range   Hemoglobin A1C 5.3    POCT URINALYSIS DIPSTICK      Result Value Ref Range   Color, UA yellow     Clarity, UA clear     Glucose, UA neg     Bilirubin, UA small     Ketones, UA neg     Spec Grav, UA >=1.030     Blood, UA large     pH, UA 6.0     Protein, UA 100     Urobilinogen, UA 4.0     Nitrite, UA neg     Leukocytes, UA Negative    POCT UA - MICROSCOPIC ONLY      Result Value Ref Range   WBC, Ur, HPF, POC neg     RBC, urine, microscopic TNTC     Bacteria, U Microscopic trace     Mucus, UA trace     Epithelial cells, urine per micros 0-3     Crystals, Ur, HPF, POC neg     Casts, Ur, LPF, POC neg     Yeast, UA neg    POCT URINE PREGNANCY      Result Value Ref Range   Preg Test, Ur Negative      Assessment and Plan: Fever - Plan: POCT CBC, POCT urine pregnancy, CBC  Acute pyelonephritis - Plan: POCT CBC, POCT urinalysis dipstick, POCT UA - Microscopic Only, cefTRIAXone (ROCEPHIN) injection 1 g, Urine culture  Hyperglycemia - Plan: POCT glycosylated hemoglobin (Hb A1C)  Improved pyelonephritis. Taking PO liquids ok. Will repeat her rocephin shot today.   A1c is normal; slightly high glucose yesterday likely due to her having a sweet tea prior to exam.   minimal hypokalemia; encouraged potassium rich foods such as bananas.  Close follow-up if not continuing to improve.    Signed Abbe Amsterdam, MD  07/18/13: called to check on her.  She is doing well, sx are resolved.  Reminded to come in for a repeat CBC soon, also will recheck her urine as she had microhematuria without  a positive culture (however was already on abx at the time).  She IS a smoker.  She plans to do so soon

## 2013-06-30 NOTE — Patient Instructions (Addendum)
You do not have diabetes- your A1c test is normal.  Your white blood cell count is better.  Continue taking your medications as directed    Let me know if you do not continue to improve- please let me know right away if you are worse!    Please come and see us in about 2 weeks for a repeat CBC as a lab visit only

## 2013-07-01 LAB — URINE CULTURE
Colony Count: NO GROWTH
ORGANISM ID, BACTERIA: NO GROWTH

## 2013-07-18 NOTE — Addendum Note (Signed)
Addended by: Abbe AmsterdamOPLAND, Tamim Skog C on: 07/18/2013 10:12 AM   Modules accepted: Orders

## 2013-07-30 ENCOUNTER — Encounter: Payer: Self-pay | Admitting: Family Medicine

## 2013-11-30 ENCOUNTER — Other Ambulatory Visit: Payer: Self-pay | Admitting: Physician Assistant

## 2014-07-24 ENCOUNTER — Encounter: Payer: Self-pay | Admitting: Family Medicine

## 2014-12-04 ENCOUNTER — Encounter: Payer: Self-pay | Admitting: Internal Medicine

## 2015-02-13 ENCOUNTER — Encounter: Payer: Self-pay | Admitting: Internal Medicine

## 2015-02-13 ENCOUNTER — Ambulatory Visit (INDEPENDENT_AMBULATORY_CARE_PROVIDER_SITE_OTHER): Payer: Managed Care, Other (non HMO) | Admitting: Internal Medicine

## 2015-02-13 VITALS — BP 130/86 | HR 80 | Ht 68.0 in | Wt 214.8 lb

## 2015-02-13 DIAGNOSIS — K644 Residual hemorrhoidal skin tags: Secondary | ICD-10-CM

## 2015-02-13 HISTORY — DX: Residual hemorrhoidal skin tags: K64.4

## 2015-02-13 NOTE — Progress Notes (Signed)
  Referred by: Jarome MatinPaterson, Daniel, MD  Subjective:    Patient ID: Meredith Figueroa, female    DOB: Aug 22, 1970, 44 y.o.   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 Dr. Jennette KettleNeal and is referred by Dr. Jarold MottoPatterson after that visit. She has difficulty cleansing at times, she does not have any bleeding, soiling or prolapse she assesses persistent protrusion. Medications, allergies, past medical history, past surgical history, family history and social history are reviewed and updated in the EMR.  Review of Systems As per history of present illness all other review of systems are negative.    Objective:   Physical Exam BP 130/86 mmHg  Pulse 80  Ht 5\' 8"  (1.727 m)  Wt 214 lb 12.8 oz (97.433 kg)  BMI 32.67 kg/m2 No acute distress Appropriate mood and affect  Rectal exam Patti SwazilandJordan, CMA present.  Inspection of the anoderm shows a fleshy tag protruding in what I think is the right anterior position. Digital rectal exam reveals a small rectocele, normal resting rectal tone formed stool in the rectal vault and no mass.     Assessment & Plan:   1. Anal skin tag    It sounds to me like she most likely has a symptomatic anal skin tag. While sometimes banding internal hemorrhoids that she may have can help that the most definitive treatment would be excision. I'm referring her to Dr. Romie LeveeAlicia Thomas for evaluation and possible treatment. The patient is excepting of the plan. She is hopeful that a local anesthetic and office based procedure is possible.  I appreciate the opportunity to care for this patient. CC: Garlan FillersPATERSON,Meredith G, MD Varney Baasonald Neal M.D. and Romie LeveeAlicia Thomas M.D.

## 2015-02-13 NOTE — Patient Instructions (Signed)
  You have been scheduled for an appointment with Dr Darrold SpanAllicia Thomas at [facility]. Your appointment is on November 14th at 10:00AM.  Make certain to bring a list of current medications, including any over the counter medications or vitamins. Also bring your co-pay if you have one as well as your insurance cards. [facility] is located at [address], [address]. Should you need to reschedule your appointment, please contact them at [phone].    I appreciate the opportunity to care for you. Stan Headarl Gessner, MD, [facility]

## 2016-04-17 ENCOUNTER — Ambulatory Visit (INDEPENDENT_AMBULATORY_CARE_PROVIDER_SITE_OTHER): Payer: Managed Care, Other (non HMO) | Admitting: Urgent Care

## 2016-04-17 ENCOUNTER — Telehealth: Payer: Self-pay

## 2016-04-17 ENCOUNTER — Encounter: Payer: Self-pay | Admitting: Urgent Care

## 2016-04-17 VITALS — BP 132/62 | HR 75 | Temp 98.7°F | Ht 68.0 in | Wt 245.6 lb

## 2016-04-17 DIAGNOSIS — R635 Abnormal weight gain: Secondary | ICD-10-CM | POA: Diagnosis not present

## 2016-04-17 DIAGNOSIS — Z833 Family history of diabetes mellitus: Secondary | ICD-10-CM | POA: Diagnosis not present

## 2016-04-17 DIAGNOSIS — E669 Obesity, unspecified: Secondary | ICD-10-CM

## 2016-04-17 DIAGNOSIS — Z8619 Personal history of other infectious and parasitic diseases: Secondary | ICD-10-CM

## 2016-04-17 DIAGNOSIS — H00015 Hordeolum externum left lower eyelid: Secondary | ICD-10-CM

## 2016-04-17 DIAGNOSIS — R300 Dysuria: Secondary | ICD-10-CM

## 2016-04-17 LAB — POCT URINALYSIS DIP (MANUAL ENTRY)
Bilirubin, UA: NEGATIVE
Glucose, UA: NEGATIVE
Ketones, POC UA: NEGATIVE
LEUKOCYTES UA: NEGATIVE
NITRITE UA: NEGATIVE
PROTEIN UA: NEGATIVE
Spec Grav, UA: 1.01
Urobilinogen, UA: 0.2
pH, UA: 6.5

## 2016-04-17 MED ORDER — VALACYCLOVIR HCL 1 G PO TABS: ORAL_TABLET | ORAL | 6 refills | Status: DC

## 2016-04-17 MED ORDER — CEPHALEXIN 500 MG PO CAPS: 500.0000 mg | ORAL_CAPSULE | Freq: Three times a day (TID) | ORAL | 0 refills | Status: DC

## 2016-04-17 NOTE — Patient Instructions (Addendum)
Stye A stye is a bump on your eyelid caused by a bacterial infection. A stye can form inside the eyelid (internal stye) or outside the eyelid (external stye). An internal stye may be caused by an infected oil-producing gland inside your eyelid. An external stye may be caused by an infection at the base of your eyelash (hair follicle). Styes are very common. Anyone can get them at any age. They usually occur in just one eye, but you may have more than one in either eye. What are the causes? The infection is almost always caused by bacteria called Staphylococcus aureus. This is a common type of bacteria that lives on your skin. What increases the risk? You may be at higher risk for a stye if you have had one before. You may also be at higher risk if you have:  Diabetes.  Long-term illness.  Long-term eye redness.  A skin condition called seborrhea.  High fat levels in your blood (lipids). What are the signs or symptoms? Eyelid pain is the most common symptom of a stye. Internal styes are more painful than external styes. Other signs and symptoms may include:  Painful swelling of your eyelid.  A scratchy feeling in your eye.  Tearing and redness of your eye.  Pus draining from the stye. How is this diagnosed? Your health care provider may be able to diagnose a stye just by examining your eye. The health care provider may also check to make sure:  You do not have a fever or other signs of a more serious infection.  The infection has not spread to other parts of your eye or areas around your eye. How is this treated? Most styes will clear up in a few days without treatment. In some cases, you may need to use antibiotic drops or ointment to prevent infection. Your health care provider may have to drain the stye surgically if your stye is:  Large.  Causing a lot of pain.  Interfering with your vision. This can be done using a thin blade or a needle. Follow these instructions at  home:  Take medicines only as directed by your health care provider.  Apply a clean, warm compress to your eye for 10 minutes, 4 times a day.  Do not wear contact lenses or eye makeup until your stye has healed.  Do not try to pop or drain the stye. Contact a health care provider if:  You have chills or a fever.  Your stye does not go away after several days.  Your stye affects your vision.  Your eyeball becomes swollen, red, or painful. This information is not intended to replace advice given to you by your health care provider. Make sure you discuss any questions you have with your health care provider. Document Released: 01/08/2005 Document Revised: 11/25/2015 Document Reviewed: 07/15/2013 Elsevier Interactive Patient Education  2017 Elsevier Inc.   Diabetes Mellitus and Food It is important for you to manage your blood sugar (glucose) level. Your blood glucose level can be greatly affected by what you eat. Eating healthier foods in the appropriate amounts throughout the day at about the same time each day will help you control your blood glucose level. It can also help slow or prevent worsening of your diabetes mellitus. Healthy eating may even help you improve the level of your blood pressure and reach or maintain a healthy weight. General recommendations for healthful eating and cooking habits include:  Eating meals and snacks regularly. Avoid going long periods  of time without eating to lose weight.  Eating a diet that consists mainly of plant-based foods, such as fruits, vegetables, nuts, legumes, and whole grains.  Using low-heat cooking methods, such as baking, instead of high-heat cooking methods, such as deep frying. Work with your dietitian to make sure you understand how to use the Nutrition Facts information on food labels. How can food affect me? Carbohydrates  Carbohydrates affect your blood glucose level more than any other type of food. Your dietitian will help  you determine how many carbohydrates to eat at each meal and teach you how to count carbohydrates. Counting carbohydrates is important to keep your blood glucose at a healthy level, especially if you are using insulin or taking certain medicines for diabetes mellitus. Alcohol  Alcohol can cause sudden decreases in blood glucose (hypoglycemia), especially if you use insulin or take certain medicines for diabetes mellitus. Hypoglycemia can be a life-threatening condition. Symptoms of hypoglycemia (sleepiness, dizziness, and disorientation) are similar to symptoms of having too much alcohol. If your health care provider has given you approval to drink alcohol, do so in moderation and use the following guidelines:  Women should not have more than one drink per day, and men should not have more than two drinks per day. One drink is equal to:  12 oz of beer.  5 oz of wine.  1 oz of hard liquor.  Do not drink on an empty stomach.  Keep yourself hydrated. Have water, diet soda, or unsweetened iced tea.  Regular soda, juice, and other mixers might contain a lot of carbohydrates and should be counted. What foods are not recommended? As you make food choices, it is important to remember that all foods are not the same. Some foods have fewer nutrients per serving than other foods, even though they might have the same number of calories or carbohydrates. It is difficult to get your body what it needs when you eat foods with fewer nutrients. Examples of foods that you should avoid that are high in calories and carbohydrates but low in nutrients include:  Trans fats (most processed foods list trans fats on the Nutrition Facts label).  Regular soda.  Juice.  Candy.  Sweets, such as cake, pie, doughnuts, and cookies.  Fried foods. What foods can I eat? Eat nutrient-rich foods, which will nourish your body and keep you healthy. The food you should eat also will depend on several factors,  including:  The calories you need.  The medicines you take.  Your weight.  Your blood glucose level.  Your blood pressure level.  Your cholesterol level. You should eat a variety of foods, including:  Protein.  Lean cuts of meat.  Proteins low in saturated fats, such as fish, egg whites, and beans. Avoid processed meats.  Fruits and vegetables.  Fruits and vegetables that may help control blood glucose levels, such as apples, mangoes, and yams.  Dairy products.  Choose fat-free or low-fat dairy products, such as milk, yogurt, and cheese.  Grains, bread, pasta, and rice.  Choose whole grain products, such as multigrain bread, whole oats, and brown rice. These foods may help control blood pressure.  Fats.  Foods containing healthful fats, such as nuts, avocado, olive oil, canola oil, and fish. Does everyone with diabetes mellitus have the same meal plan? Because every person with diabetes mellitus is different, there is not one meal plan that works for everyone. It is very important that you meet with a dietitian who will help you  create a meal plan that is just right for you. This information is not intended to replace advice given to you by your health care provider. Make sure you discuss any questions you have with your health care provider. Document Released: 12/26/2004 Document Revised: 09/06/2015 Document Reviewed: 02/25/2013 Elsevier Interactive Patient Education  2017 [employer]   IF you received an x-ray today, you will receive an invoice from [facility]. Please contact [facility] at [phone] with questions or concerns regarding your invoice.   IF you received labwork today, you will receive an invoice from [facility]. Please contact [facility] at [phone] with questions or concerns regarding your invoice.   Our billing staff will not be able to assist you with questions regarding bills from these companies.  You will be contacted  with the lab results as soon as they are available. The fastest way to get your results is to activate your My Chart account. Instructions are located on the last page of this paperwork. If you have not heard from us regarding the results in 2 weeks, please contact this office.

## 2016-04-17 NOTE — Telephone Encounter (Signed)
Pt calling about lab results I informed her they were not yet completed and we would call back when results were in   Please advise  [phone]

## 2016-04-17 NOTE — Progress Notes (Signed)
MRN: [id number] DOB: 1970/10/08  Subjective:   Meredith Figueroa is a 47 y.o. female presenting for chief complaint of Eye Pain (X 1week left eye); Dysuria (X 1-2); and Medication Refill (Valtrex)  Eye pain - Reports 2 month history of intermittent left eye pain, redness and swelling. She was seen for this before at another clinic, was started on cipro eye drops. She also had eye crusting at the time, used eye compresses. Her symptoms improved but have returned and she is out of her cipro eye drops.  Dysuria - Reports 2 day history of dysuria, urinary frequency, cloudy malodorous urine, belly cramping, low back pain. She has a history of frequent UTIs, last one was in 2016. She has typically used ciprofloxacin with resolution. Denies fever, n/v, abdominal pain, flank pain, hematuria, genital rashes, vaginal discharge.  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 She is requesting refill of Valtrex today.  Malaise - In general patient reports that she does not feel good. States that she has had significant weight gain over the past year, admits an unhealthy diet, no exercise. Family history is positive for diabetes in her maternal grandmother and pre-diabetes with her mother. Admits polydipsia, polyuria, intermittent blurred vision, constipation, bloating. Denies smoking cigarettes.  Meredith Figueroa has a current medication list which includes the following prescription(s): alprazolam, valacyclovir, and ciprofloxacin. Also has No Known Allergies.  Meredith Figueroa  has a past medical history of Allergy; Anal skin tag (02/13/2015); and HSV infection. Also  has a past surgical history that includes Tubal ligation (08/2011) and Ablation. Has family history includes Arthritis in her mother; Cancer in her father; Diabetes in her mother; Heart disease in her mother.   Objective:   Vitals: BP 132/62 (BP Location: Left Arm, Patient Position: Sitting, Cuff Size: Large)   Pulse 75   Temp  98.7 F (37.1 C) (Oral)   Ht 5\' 8"  (1.727 m)   Wt 245 lb 9.6 oz (111.4 kg)   SpO2 97%   BMI 37.34 kg/m   Wt Readings from Last 3 Encounters:  04/17/16 245 lb 9.6 oz (111.4 kg)  02/13/15 214 lb 12.8 oz (97.4 kg)  06/30/13 205 lb (93 kg)    Physical Exam  Constitutional: She is oriented to person, place, and time. She appears well-developed and well-nourished.  HENT:  Mouth/Throat: Oropharynx is clear and moist.  Eyes: EOM are normal. Pupils are equal, round, and reactive to light. Right eye exhibits no discharge. Left eye exhibits no discharge.  Left medial lower eyelid with stye ~0.5cm.  Neck: Normal range of motion. Neck supple.  Cardiovascular: Normal rate, regular rhythm and intact distal pulses.  Exam reveals no gallop and no friction rub.   No murmur heard. Pulmonary/Chest: No respiratory distress. She has no wheezes. She has no rales.  Abdominal: Soft. Bowel sounds are normal. She exhibits no distension and no mass. There is tenderness (RUQ, lower pelvic). There is no guarding.  Lymphadenopathy:    She has no cervical adenopathy.  Neurological: She is alert and oriented to person, place, and time.  Skin: Skin is warm and dry.   Results for orders placed or performed in visit on 04/17/16 (from the past 24 hour(s))  POCT urinalysis dipstick     Status: Abnormal   Collection Time: 04/17/16 12:45 PM  Result Value Ref Range   Color, UA yellow yellow   Clarity, UA clear clear   Glucose, UA negative negative   Bilirubin, UA  negative negative   Ketones, POC UA negative negative   Spec Grav, UA 1.010    Blood, UA trace-intact (A) negative   pH, UA 6.5    Protein Ur, POC negative negative   Urobilinogen, UA 0.2    Nitrite, UA Negative Negative   Leukocytes, UA Negative Negative   Assessment and Plan :   1. Dysuria - Urine labs pending. I recommended hydrating better, urinating while at work. Patient will be on Keflex for stye which can cover for cystitis as well.  2.  Hordeolum externum of left lower eyelid - Start Keflex, warm compresses 4-5 times daily for 10-15 minutes.   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  4. Weight gain 5. Class 2 obesity without serious comorbidity in adult, unspecified BMI, unspecified obesity type 6. Family history of diabetes mellitus - Recommended dietary modifications. Labs pending.  Wallis BambergMario Mikaili Flippin, PA-C [facility] [phone] 04/17/2016 12:03 PM

## 2016-04-18 LAB — BASIC METABOLIC PANEL
BUN / CREAT RATIO: 10 (ref 9–23)
BUN: 10 mg/dL (ref 6–24)
CO2: 25 mmol/L (ref 18–29)
Calcium: 9.3 mg/dL (ref 8.7–10.2)
Chloride: 101 mmol/L (ref 96–106)
Creatinine, Ser: 1 mg/dL (ref 0.57–1.00)
GFR calc Af Amer: 78 mL/min/{1.73_m2} (ref >59)
GFR calc non Af Amer: 67 mL/min/{1.73_m2} (ref >59)
Glucose: 74 mg/dL (ref 65–99)
Potassium: 5.1 mmol/L (ref 3.5–5.2)
Sodium: 140 mmol/L (ref 134–144)

## 2016-04-18 LAB — URINALYSIS, MICROSCOPIC ONLY: CASTS: NONE SEEN /LPF

## 2016-04-18 LAB — HEMOGLOBIN A1C
ESTIMATED AVERAGE GLUCOSE: 111 mg/dL
Hgb A1c MFr Bld: 5.5 % (ref 4.8–5.6)

## 2016-04-18 NOTE — Telephone Encounter (Signed)
Please let patient know that the urine culture can take 3-4 days to result. I will be in contact with her once it is completed.

## 2016-04-21 LAB — URINE CULTURE

## 2016-04-22 NOTE — Telephone Encounter (Signed)
I discussed labs with patient yesterday. Refer to lab note from 04/21/2016.

## 2016-04-22 NOTE — Telephone Encounter (Signed)
Please see results and advise.

## 2016-09-25 ENCOUNTER — Ambulatory Visit (INDEPENDENT_AMBULATORY_CARE_PROVIDER_SITE_OTHER): Payer: Managed Care, Other (non HMO) | Admitting: Family Medicine

## 2016-09-25 ENCOUNTER — Encounter: Payer: Self-pay | Admitting: Family Medicine

## 2016-09-25 VITALS — BP 130/85 | HR 84 | Temp 98.1°F | Resp 16 | Ht 68.0 in | Wt 237.6 lb

## 2016-09-25 DIAGNOSIS — J309 Allergic rhinitis, unspecified: Secondary | ICD-10-CM

## 2016-09-25 DIAGNOSIS — Z72820 Sleep deprivation: Secondary | ICD-10-CM | POA: Diagnosis not present

## 2016-09-25 DIAGNOSIS — F17200 Nicotine dependence, unspecified, uncomplicated: Secondary | ICD-10-CM | POA: Diagnosis not present

## 2016-09-25 DIAGNOSIS — D582 Other hemoglobinopathies: Secondary | ICD-10-CM | POA: Diagnosis not present

## 2016-09-25 DIAGNOSIS — R310 Gross hematuria: Secondary | ICD-10-CM | POA: Diagnosis not present

## 2016-09-25 DIAGNOSIS — R8290 Unspecified abnormal findings in urine: Secondary | ICD-10-CM | POA: Diagnosis not present

## 2016-09-25 DIAGNOSIS — G2581 Restless legs syndrome: Secondary | ICD-10-CM

## 2016-09-25 LAB — POCT CBC
Granulocyte percent: 66.9 %G (ref 37–80)
HCT, POC: 47.8 % (ref 37.7–47.9)
Hemoglobin: 16.2 g/dL (ref 12.2–16.2)
LYMPH, POC: 2.5 (ref 0.6–3.4)
MCH, POC: 31.1 pg (ref 27–31.2)
MCHC: 33.9 g/dL (ref 31.8–35.4)
MCV: 91.8 fL (ref 80–97)
MID (CBC): 0.6 (ref 0–0.9)
MPV: 9.7 fL (ref 0–99.8)
PLATELET COUNT, POC: 184 10*3/uL (ref 142–424)
POC Granulocyte: 6.2 (ref 2–6.9)
POC LYMPH %: 27.1 % (ref 10–50)
POC MID %: 6 %M (ref 0–12)
RBC: 5.2 M/uL (ref 4.04–5.48)
RDW, POC: 13.9 %
WBC: 9.3 10*3/uL (ref 4.6–10.2)

## 2016-09-25 LAB — POC MICROSCOPIC URINALYSIS (UMFC): MUCUS RE: ABSENT

## 2016-09-25 LAB — POCT URINALYSIS DIP (MANUAL ENTRY)
Bilirubin, UA: NEGATIVE
Glucose, UA: NEGATIVE mg/dL
Ketones, POC UA: NEGATIVE mg/dL
LEUKOCYTES UA: NEGATIVE
NITRITE UA: NEGATIVE
PROTEIN UA: NEGATIVE mg/dL
Spec Grav, UA: <=1.005 — AB (ref 1.010–1.025)
UROBILINOGEN UA: 0.2 U/dL
pH, UA: 6.5 (ref 5.0–8.0)

## 2016-09-25 MED ORDER — VARENICLINE TARTRATE 0.5 MG X 11 & 1 MG X 42 PO MISC: ORAL | 0 refills | Status: DC

## 2016-09-25 NOTE — Patient Instructions (Addendum)
   IF you received an x-ray today, you will receive an invoice from [facility]. Please contact [facility] at [phone] with questions or concerns regarding your invoice.   IF you received labwork today, you will receive an invoice from [facility]. Please contact [facility] at [phone] with questions or concerns regarding your invoice.   Our billing staff will not be able to assist you with questions regarding bills from these companies.  You will be contacted with the lab results as soon as they are available. The fastest way to get your results is to activate your My Chart account. Instructions are located on the last page of this paperwork. If you have not heard from us regarding the results in 2 weeks, please contact this office.     Steps to Quit Smoking Smoking tobacco can be harmful to your health and can affect almost every organ in your body. Smoking puts you, and those around you, at risk for developing many serious chronic diseases. Quitting smoking is difficult, but it is one of the best things that you can do for your health. It is never too late to quit. What are the benefits of quitting smoking? When you quit smoking, you lower your risk of developing serious diseases and conditions, such as:  Lung cancer or lung disease, such as COPD.  Heart disease.  Stroke.  Heart attack.  Infertility.  Osteoporosis and bone fractures.  Additionally, symptoms such as coughing, wheezing, and shortness of breath may get better when you quit. You may also find that you get sick less often because your body is stronger at fighting off colds and infections. If you are pregnant, quitting smoking can help to reduce your chances of having a baby of low birth weight. How do I get ready to quit? When you decide to quit smoking, create a plan to make sure that you are successful. Before you quit:  Pick a date to quit. Set a date within the next two weeks to give you  time to prepare.  Write down the reasons why you are quitting. Keep this list in places where you will see it often, such as on your bathroom mirror or in your car or wallet.  Identify the people, places, things, and activities that make you want to smoke (triggers) and avoid them. Make sure to take these actions: ? Throw away all cigarettes at home, at work, and in your car. ? Throw away smoking accessories, such as ashtrays and lighters. ? Clean your car and make sure to empty the ashtray. ? Clean your home, including curtains and carpets.  Tell your family, friends, and coworkers that you are quitting. Support from your loved ones can make quitting easier.  Talk with your health care provider about your options for quitting smoking.  Find out what treatment options are covered by your health insurance.  What strategies can I use to quit smoking? Talk with your healthcare provider about different strategies to quit smoking. Some strategies include:  Quitting smoking altogether instead of gradually lessening how much you smoke over a period of time. Research shows that quitting "cold turkey" is more successful than gradually quitting.  Attending in-person counseling to help you build problem-solving skills. You are more likely to have success in quitting if you attend several counseling sessions. Even short sessions of 10 minutes can be effective.  Finding resources and support systems that can help you to quit smoking and remain smoke-free after you quit. These resources are   most helpful when you use them often. They can include: ? Online chats with a counselor. ? Telephone quitlines. ? Printed self-help materials. ? Support groups or group counseling. ? Text messaging programs. ? Mobile phone applications.  Taking medicines to help you quit smoking. (If you are pregnant or breastfeeding, talk with your health care provider first.) Some medicines contain nicotine and some do not.  Both types of medicines help with cravings, but the medicines that include nicotine help to relieve withdrawal symptoms. Your health care provider may recommend: ? Nicotine patches, gum, or lozenges. ? Nicotine inhalers or sprays. ? Non-nicotine medicine that is taken by mouth.  Talk with your health care provider about combining strategies, such as taking medicines while you are also receiving in-person counseling. Using these two strategies together makes you more likely to succeed in quitting than if you used either strategy on its own. If you are pregnant or breastfeeding, talk with your health care provider about finding counseling or other support strategies to quit smoking. Do not take medicine to help you quit smoking unless told to do so by your health care provider. What things can I do to make it easier to quit? Quitting smoking might feel overwhelming at first, but there is a lot that you can do to make it easier. Take these important actions:  Reach out to your family and friends and ask that they support and encourage you during this time. Call telephone quitlines, reach out to support groups, or work with a counselor for support.  Ask people who smoke to avoid smoking around you.  Avoid places that trigger you to smoke, such as bars, parties, or smoke-break areas at work.  Spend time around people who do not smoke.  Lessen stress in your life, because stress can be a smoking trigger for some people. To lessen stress, try: ? Exercising regularly. ? Deep-breathing exercises. ? Yoga. ? Meditating. ? Performing a body scan. This involves closing your eyes, scanning your body from head to toe, and noticing which parts of your body are particularly tense. Purposefully relax the muscles in those areas.  Download or purchase mobile phone or tablet apps (applications) that can help you stick to your quit plan by providing reminders, tips, and encouragement. There are many free apps,  such as QuitGuide from the CDC (Centers for Disease Control and Prevention). You can find other support for quitting smoking (smoking cessation) through smokefree.gov and other websites.  How will I feel when I quit smoking? Within the first 24 hours of quitting smoking, you may start to feel some withdrawal symptoms. These symptoms are usually most noticeable 2-3 days after quitting, but they usually do not last beyond 2-3 weeks. Changes or symptoms that you might experience include:  Mood swings.  Restlessness, anxiety, or irritation.  Difficulty concentrating.  Dizziness.  Strong cravings for sugary foods in addition to nicotine.  Mild weight gain.  Constipation.  Nausea.  Coughing or a sore throat.  Changes in how your medicines work in your body.  A depressed mood.  Difficulty sleeping (insomnia).  After the first 2-3 weeks of quitting, you may start to notice more positive results, such as:  Improved sense of smell and taste.  Decreased coughing and sore throat.  Slower heart rate.  Lower blood pressure.  Clearer skin.  The ability to breathe more easily.  Fewer sick days.  Quitting smoking is very challenging for most people. Do not get discouraged if you are not successful the   first time. Some people need to make many attempts to quit before they achieve long-term success. Do your best to stick to your quit plan, and talk with your health care provider if you have any questions or concerns. This information is not intended to replace advice given to you by your health care provider. Make sure you discuss any questions you have with your health care provider. Document Released: 03/25/2001 Document Revised: 11/27/2015 Document Reviewed: 08/15/2014 Elsevier Interactive Patient Education  2017 [employer] Inc.  

## 2016-09-25 NOTE — Progress Notes (Signed)
.  Chief Complaint  Patient presents with  . other    Review abnormal labs     HPI   Patient went to Gynecology 2 days ago She had a hemoglobin check that was high. She reports that her urine test was also abnormal She states that they told her to get checked at a primary care office She had high proteins in the urine.  Tobacco use She has been smoking a pack a day for 30 years She reports that she has fatigue and shortness of breath She does not feel like she is breathing well and has congestion She reports that she quit smoking during pregnancy. She reports that her mental health seems weeks   Pre-hypertension She reports that her bps are typically 130s She denies chest pain, palpitations, lower extremity edema. She does not have a history of hypertension BP Readings from Last 3 Encounters:  09/25/16 130/85  04/17/16 132/62  02/13/15 130/86    Restless leg  Pt reports that she tosses and turns nightly She wakes up and cannot go back to sleep She does leg massages to help Or has to get up and shake the legs out   Past Medical History:  Diagnosis Date  . Allergy   . Anal skin tag 02/13/2015  . HSV infection    twice annually; mouth    Current Outpatient Prescriptions  Medication Sig Dispense Refill  . ALPRAZolam (XANAX) 0.5 MG tablet Take 1 tablet (0.5 mg total) by mouth at bedtime as needed for sleep. 15 tablet 0  . valACYclovir (VALTREX) 1000 MG tablet Take 2 tabs twice a day for 1 day for cold sores. 30 tablet 6  . ciprofloxacin (CILOXAN) 0.3 % ophthalmic solution Place 1 drop into the left eye 4 (four) times daily.    . varenicline (CHANTIX STARTING MONTH PAK) 0.5 MG X 11 & 1 MG X 42 tablet Take one 0.5 mg tab by mouth once daily for 3 days, then increase to one 0.5 mg tab twice daily for 4 days, then increase to one 1 mg tab twice daily. 53 tablet 0   No current facility-administered medications for this visit.     Allergies: No Known Allergies  Past  Surgical History:  Procedure Laterality Date  . ABLATION    . TUBAL LIGATION  08/2011    Social History   Social History  . Marital status: Divorced    Spouse name: Marikay AlarDavid Bowens  . Number of children: 1  . Years of education: 4214   Occupational History  . Marine scientistinsurance underwriter    Social History Main Topics  . Smoking status: Current Every Day Smoker    Packs/day: 1.00    Types: Cigarettes    Start date: 01/07/1986  . Smokeless tobacco: Never Used  . Alcohol use Yes     Comment: occasional  . Drug use: No  . Sexual activity: Yes    Partners: Male    Birth control/ protection: Surgical   Other Topics Concern  . 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 Negative for chills, fever and weight loss.  HENT: Positive for congestion. Negative for hearing loss, sinus pain and sore throat.   Eyes: Negative for blurred vision and double vision.  Cardiovascular: Negative for chest pain and palpitations.  Neurological: Negative for dizziness, tingling,  tremors and headaches.    Objective: Vitals:   09/25/16 0839  BP: 130/85  Pulse: 84  Resp: 16  Temp: 98.1 F (36.7 C)  TempSrc: Oral  SpO2: 96%  Weight: 237 lb 9.6 oz (107.8 kg)  Height: 5\' 8"  (1.727 m)  Body mass index is 36.13 kg/m.   Physical Exam  Constitutional: She is oriented to person, place, and time. She appears well-developed and well-nourished.  HENT:  Head: Normocephalic and atraumatic.  Right Ear: External ear normal.  Left Ear: External ear normal.  Mouth/Throat: Oropharynx is clear and moist.  Deviated septum  Eyes: Conjunctivae and EOM are normal.  Cardiovascular: Normal rate, regular rhythm and normal heart sounds.   Pulmonary/Chest: Effort normal and breath sounds normal. No respiratory distress. She has no wheezes. She has no rales.    Musculoskeletal: Normal range of motion. She exhibits no edema.  Neurological: She is alert and oriented to person, place, and time.  Skin: Skin is warm. Capillary refill takes less than 2 seconds. No rash noted.    Assessment and Plan Euline was seen today for other.  Diagnoses and all orders for this visit:  Tobacco use disorder- discussed smoking cessation at length for greater than 10 minutes.  -     varenicline (CHANTIX STARTING MONTH PAK) 0.5 MG X 11 & 1 MG X 42 tablet; Take one 0.5 mg tab by mouth once daily for 3 days, then increase to one 0.5 mg tab twice daily for 4 days, then increase to one 1 mg tab twice daily.  Abnormal hemoglobin (HCC)- verified elevated hgb but not out of the normal range.  -     POCT CBC -     Ferritin -     Comprehensive metabolic panel -     TSH  Abnormal urine- no protein noted -     POCT urinalysis dipstick  Chronic allergic rhinitis- continue flonase prn May add zyrtec if needed  Poor sleep Restless leg syndrome Discussed ways to treat restless less and will check to see if there is an underlying ferritin deficiency -     Ferritin  Gross hematuria- pt reports that this is a chronic issue -     POCT Microscopic Urinalysis ([facility])   A total of 40 minutes were spent face-to-face with the patient during this encounter and over half of that time was spent on counseling and coordination of care.      Counseling extensively on chantix,  Tequila Rottmann A Asa Baudoin

## 2016-09-26 LAB — FERRITIN: Ferritin: 128 ng/mL (ref 15–150)

## 2016-09-26 LAB — COMPREHENSIVE METABOLIC PANEL
ALT: 23 IU/L (ref 0–32)
AST: 15 IU/L (ref 0–40)
Albumin/Globulin Ratio: 1.6 (ref 1.2–2.2)
Albumin: 4.4 g/dL (ref 3.5–5.5)
Alkaline Phosphatase: 118 IU/L — ABNORMAL HIGH (ref 39–117)
BILIRUBIN TOTAL: 0.9 mg/dL (ref 0.0–1.2)
BUN / CREAT RATIO: 9 (ref 9–23)
BUN: 10 mg/dL (ref 6–24)
CALCIUM: 9.7 mg/dL (ref 8.7–10.2)
CO2: 23 mmol/L (ref 20–29)
CREATININE: 1.1 mg/dL — AB (ref 0.57–1.00)
Chloride: 103 mmol/L (ref 96–106)
GFR, EST AFRICAN AMERICAN: 69 mL/min/{1.73_m2} (ref >59)
GFR, EST NON AFRICAN AMERICAN: 60 mL/min/{1.73_m2} (ref >59)
GLUCOSE: 99 mg/dL (ref 65–99)
Globulin, Total: 2.8 g/dL (ref 1.5–4.5)
POTASSIUM: 5 mmol/L (ref 3.5–5.2)
Sodium: 141 mmol/L (ref 134–144)
TOTAL PROTEIN: 7.2 g/dL (ref 6.0–8.5)

## 2016-09-26 LAB — TSH: TSH: 0.551 u[IU]/mL (ref 0.450–4.500)

## 2018-04-09 ENCOUNTER — Other Ambulatory Visit: Payer: Self-pay | Admitting: Urgent Care

## 2018-04-09 ENCOUNTER — Other Ambulatory Visit: Payer: Self-pay

## 2018-04-09 DIAGNOSIS — B009 Herpesviral infection, unspecified: Secondary | ICD-10-CM

## 2018-04-09 MED ORDER — VALACYCLOVIR HCL 1 G PO TABS: ORAL_TABLET | ORAL | 6 refills | Status: DC

## 2018-05-10 ENCOUNTER — Other Ambulatory Visit: Payer: Self-pay

## 2018-05-10 ENCOUNTER — Encounter: Payer: Self-pay | Admitting: Family Medicine

## 2018-05-10 ENCOUNTER — Ambulatory Visit: Payer: Self-pay | Admitting: *Deleted

## 2018-05-10 ENCOUNTER — Ambulatory Visit: Payer: Managed Care, Other (non HMO) | Admitting: Family Medicine

## 2018-05-10 VITALS — BP 148/84 | HR 66 | Temp 98.7°F | Resp 17 | Ht 68.0 in | Wt 237.0 lb

## 2018-05-10 DIAGNOSIS — B9789 Other viral agents as the cause of diseases classified elsewhere: Secondary | ICD-10-CM | POA: Diagnosis not present

## 2018-05-10 DIAGNOSIS — R6889 Other general symptoms and signs: Secondary | ICD-10-CM | POA: Diagnosis not present

## 2018-05-10 DIAGNOSIS — J069 Acute upper respiratory infection, unspecified: Secondary | ICD-10-CM

## 2018-05-10 LAB — POC INFLUENZA A&B (BINAX/QUICKVUE)
Influenza A, POC: NEGATIVE
Influenza B, POC: NEGATIVE

## 2018-05-10 MED ORDER — FLUTICASONE PROPIONATE 50 MCG/ACT NA SUSP: 2.0000 | Freq: Every day | NASAL | 6 refills | Status: DC

## 2018-05-10 NOTE — Progress Notes (Signed)
Established Patient Office Visit  Subjective:  Patient ID: Meredith Figueroa, female    DOB: [birth year]  Age: 48 y.o. MRN: [id number]  CC:  Chief Complaint  Patient presents with  . exposure to influzena, friend dx with flu on Friday and they    Onset: late friday afternoon-Saturday, fevers,chills,bodyaches, sob,nonproductive cough, headache, chills, fatigue and loss of appetite.  taking mucinex dm, advil and sudafed for symptoms  . Medication Refill    xanax    HPI Meredith Figueroa presents for   Pt reports exposure to flu from a friend who was diagnosed with the flu 3 days history of symptoms She has been having fevers, chills, anorexia, fatigue She denies diarrhea or vomiting She reports taking mucinex dm, advil, sudafed She feels tired and cannot go to work   Past Medical History:  Diagnosis Date  . Allergy   . Anal skin tag 02/13/2015  . HSV infection    twice annually; mouth    Past Surgical History:  Procedure Laterality Date  . ABLATION    . TUBAL LIGATION  08/2011    Family History  Problem Relation Age of Onset  . Diabetes Mother   . Arthritis Mother   . Heart disease Mother   . Cancer Father     Social History   Socioeconomic History  . Marital status: Divorced    Spouse name: Marikay AlarDavid Cadden  . Number of children: 1  . Years of education: 6814  . Highest education level: Not on file  Occupational History  . Occupation: Marine scientistinsurance underwriter  Social Needs  . Financial resource strain: Not on file  . Food insecurity:    Worry: Not on file    Inability: Not on file  . Transportation needs:    Medical: Not on file    Non-medical: Not on file  Tobacco Use  . Smoking status: Current Every Day Smoker    Packs/day: 1.00    Types: Cigarettes    Start date: 01/07/1986  . Smokeless tobacco: Never Used  Substance and Sexual Activity  . Alcohol use: Yes    Comment: occasional  . Drug use: No  . Sexual activity: Yes    Partners: Male    Birth  control/protection: Surgical  Lifestyle  . Physical activity:    Days per week: Not on file    Minutes per session: Not on file  . Stress: Not on file  Relationships  . Social connections:    Talks on phone: Not on file    Gets together: Not on file    Attends religious service: Not on file    Active member of club or organization: Not on file    Attends meetings of clubs or organizations: Not on file    Relationship status: Not on file  . Intimate partner violence:    Fear of current or ex partner: Not on file    Emotionally abused: Not on file    Physically abused: Not on file    Forced sexual activity: Not on file  Other Topics Concern  . 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 ALPRAZolam (XANAX) 0.5 MG tablet Take 1 tablet (0.5 mg total) by mouth at bedtime as needed for sleep. 15 tablet 0  . ciprofloxacin (CILOXAN) 0.3 % ophthalmic solution Place  1 drop into the left eye 4 (four) times daily.    . valACYclovir (VALTREX) 1000 MG tablet Take 2 tabs twice a day for 1 day for cold sores. 30 tablet 6  . varenicline (CHANTIX STARTING MONTH PAK) 0.5 MG X 11 & 1 MG X 42 tablet Take one 0.5 mg tab by mouth once daily for 3 days, then increase to one 0.5 mg tab twice daily for 4 days, then increase to one 1 mg tab twice daily. 53 tablet 0   No facility-administered medications prior to visit.     No Known Allergies  ROS Review of Systems Review of Systems   Gastrointestinal: Negative for diarrhea, nausea and vomiting.  Genitourinary: Negative for difficulty urinating, dysuria, flank pain and hematuria.  Musculoskeletal: Negative for back pain, joint swelling and neck pain.  Neurological: Negative for dizziness, speech difficulty, light-headedness and numbness.  See HPI. All other review of systems  negative.     Objective:    Physical Exam  BP (!) 148/84 (BP Location: Right Arm, Patient Position: Sitting, Cuff Size: Large)   Pulse 66   Temp 98.7 F (37.1 C) (Oral)   Resp 17   Ht 5\' 8"  (1.727 m)   Wt 237 lb (107.5 kg)   SpO2 96%   BMI 36.04 kg/m  Wt Readings from Last 3 Encounters:  05/10/18 237 lb (107.5 kg)  09/25/16 237 lb 9.6 oz (107.8 kg)  04/17/16 245 lb 9.6 oz (111.4 kg)   General: alert, oriented, in NAD Head: normocephalic, atraumatic, no sinus tenderness Eyes: EOM intact, no scleral icterus or conjunctival injection Ears: TM clear bilaterally Nose: mucosa nonerythematous, nonedematous Throat: no pharyngeal exudate or erythema Lymph: no posterior auricular, submental or cervical lymph adenopathy Heart: normal rate, normal sinus rhythm, no murmurs Lungs: clear to auscultation bilaterally, no wheezing    Health Maintenance Due  Topic Date Due  . HIV Screening  11/12/1983  . TETANUS/TDAP  11/12/1987  . PAP SMEAR-Modifier  11/11/1989  . INFLUENZA VACCINE  11/12/2017    There are no preventive care reminders to display for this patient.  Lab Results  Component Value Date   TSH 0.551 09/25/2016   Lab Results  Component Value Date   WBC 9.3 09/25/2016   HGB 16.2 09/25/2016   HCT 47.8 09/25/2016   MCV 91.8 09/25/2016   PLT 172 09/09/2008   Lab Results  Component Value Date   NA 141 09/25/2016   K 5.0 09/25/2016   CO2 23 09/25/2016   GLUCOSE 99 09/25/2016   BUN 10 09/25/2016   CREATININE 1.10 (H) 09/25/2016   BILITOT 0.9 09/25/2016   ALKPHOS 118 (H) 09/25/2016   AST 15 09/25/2016   ALT 23 09/25/2016   PROT 7.2 09/25/2016   ALBUMIN 4.4 09/25/2016   CALCIUM 9.7 09/25/2016   No results found for: CHOL No results found for: HDL No results found for: LDLCALC No results found for: TRIG No results found for: CHOLHDL Lab Results  Component Value Date   HGBA1C 5.5 04/17/2016      Assessment & Plan:   Problem List Items Addressed This  Visit    None    Visit Diagnoses    Flu-like symptoms    -  Primary   Relevant Orders   POC Influenza A&B(BINAX/QUICKVUE) (Completed)   Viral URI with cough       Relevant Medications   fluticasone (FLONASE) 50 MCG/ACT nasal spray     Discussed viral etiology Discussed otc decongestant Advised flonase for  congestion  Increase hydration Vitamin C and zinc lozenges suggested Return to clinic if symptoms worse   Meds ordered this encounter  Medications  . fluticasone (FLONASE) 50 MCG/ACT nasal spray    Sig: Place 2 sprays into both nostrils daily.    Dispense:  16 g    Refill:  6    Follow-up: Return if symptoms worsen or fail to improve.    Doristine BosworthZoe A Margerie Fraiser, MD

## 2018-05-10 NOTE — Telephone Encounter (Signed)
   Reason for Disposition . [1] Patient is NOT HIGH RISK AND [2] strongly requests antiviral medicine AND [3] flu symptoms present < 48 hours    Patient has appointment today- she will request mask when she gets to office.  Answer Assessment - Initial Assessment Questions 1. WORST SYMPTOM: "What is your worst symptom?" (e.g., cough, runny nose, muscle aches, headache, sore throat, fever)      Body ache and weakness 2. ONSET: "When did your flu symptoms start?"      Saturday morning 3. COUGH: "How bad is the cough?"       Yes- non productive- patient is using mucinex DM 4. RESPIRATORY DISTRESS: "Describe your breathing."      SOB- moving around- with exertion not equal to when laying down 5. FEVER: "Do you have a fever?" If so, ask: "What is your temperature, how was it measured, and when did it start?"     99.5- late last night 6. EXPOSURE: "Were you exposed to someone with influenza?"       Yes- friend was diagnosed yesterday- wet at restaurant Wednesday night 7. FLU VACCINE: "Did you get a flu shot this year?"     no 8. HIGH RISK DISEASE: "Do you any chronic medical problems?" (e.g., heart or lung disease, asthma, weak immune system, or other HIGH RISK conditions)     no 9. PREGNANCY: "Is there any chance you are pregnant?" "When was your last menstrual period?"     n/a 10. OTHER SYMPTOMS: "Do you have any other symptoms?"  (e.g., runny nose, muscle aches, headache, sore throat)       Muscle aches, headache, throat ache  Protocols used: INFLUENZA - SEASONAL-A-AH

## 2018-05-10 NOTE — Patient Instructions (Addendum)
     If you have lab work done today you will be contacted with your lab results within the next 2 weeks.  If you have not heard from [place] then please contact us. The fastest way to get your results is to register for My Chart.   IF you received an x-ray today, you will receive an invoice from [facility]. Please contact [facility] at [phone] with questions or concerns regarding your invoice.   IF you received labwork today, you will receive an invoice from [facility]. Please contact [facility] at [phone] with questions or concerns regarding your invoice.   Our billing staff will not be able to assist you with questions regarding bills from these companies.  You will be contacted with the lab results as soon as they are available. The fastest way to get your results is to activate your My Chart account. Instructions are located on the last page of this paperwork. If you have not heard from [place] regarding the results in 2 weeks, please contact this office.    Cool Mist Vaporizer A cool mist vaporizer is a device that releases a cool mist into the air. If you have a cough or a cold, using a vaporizer may help relieve your symptoms. The mist adds moisture to the air, which may help thin your mucus and make it less sticky. When your mucus is thin and less sticky, it easier for you to breathe and to cough up secretions. Do not use a vaporizer if you are allergic to mold. Follow these instructions at home:  Follow the instructions that come with the vaporizer.  Do not use anything other than distilled water in the vaporizer.  Do not run the vaporizer all of the time. Doing that can cause mold or bacteria to grow in the vaporizer.  Clean the vaporizer after each time that you use it.  Clean and dry the vaporizer well before storing it.  Stop using the vaporizer if your breathing symptoms get worse. This information is not intended to replace advice given to you by your  health care provider. Make sure you discuss any questions you have with your health care provider. Document Released: 12/27/2003 Document Revised: 10/19/2015 Document Reviewed: 06/30/2015 Elsevier Interactive Patient Education  2019 [employer].

## 2018-05-25 ENCOUNTER — Ambulatory Visit: Payer: Managed Care, Other (non HMO) | Admitting: Family Medicine

## 2019-04-22 ENCOUNTER — Other Ambulatory Visit: Payer: Self-pay

## 2019-04-22 ENCOUNTER — Encounter: Payer: Self-pay | Admitting: Adult Health Nurse Practitioner

## 2019-04-22 ENCOUNTER — Ambulatory Visit: Payer: Managed Care, Other (non HMO) | Admitting: Adult Health Nurse Practitioner

## 2019-04-22 ENCOUNTER — Encounter: Payer: Managed Care, Other (non HMO) | Admitting: Family Medicine

## 2019-04-22 VITALS — BP 146/70 | HR 96 | Temp 97.6°F | Ht 69.5 in | Wt 252.6 lb

## 2019-04-22 DIAGNOSIS — F411 Generalized anxiety disorder: Secondary | ICD-10-CM

## 2019-04-22 DIAGNOSIS — Z1211 Encounter for screening for malignant neoplasm of colon: Secondary | ICD-10-CM

## 2019-04-22 DIAGNOSIS — R0609 Other forms of dyspnea: Secondary | ICD-10-CM

## 2019-04-22 DIAGNOSIS — R739 Hyperglycemia, unspecified: Secondary | ICD-10-CM

## 2019-04-22 DIAGNOSIS — Z566 Other physical and mental strain related to work: Secondary | ICD-10-CM

## 2019-04-22 DIAGNOSIS — Z720 Tobacco use: Secondary | ICD-10-CM

## 2019-04-22 DIAGNOSIS — Z72820 Sleep deprivation: Secondary | ICD-10-CM

## 2019-04-22 DIAGNOSIS — I10 Essential (primary) hypertension: Secondary | ICD-10-CM

## 2019-04-22 DIAGNOSIS — R7989 Other specified abnormal findings of blood chemistry: Secondary | ICD-10-CM | POA: Diagnosis not present

## 2019-04-22 DIAGNOSIS — K219 Gastro-esophageal reflux disease without esophagitis: Secondary | ICD-10-CM

## 2019-04-22 DIAGNOSIS — Z1231 Encounter for screening mammogram for malignant neoplasm of breast: Secondary | ICD-10-CM

## 2019-04-22 DIAGNOSIS — R000 Tachycardia, unspecified: Secondary | ICD-10-CM

## 2019-04-22 DIAGNOSIS — F410 Panic disorder [episodic paroxysmal anxiety] without agoraphobia: Secondary | ICD-10-CM

## 2019-04-22 DIAGNOSIS — R635 Abnormal weight gain: Secondary | ICD-10-CM

## 2019-04-22 DIAGNOSIS — R5383 Other fatigue: Secondary | ICD-10-CM

## 2019-04-22 DIAGNOSIS — R0600 Dyspnea, unspecified: Secondary | ICD-10-CM

## 2019-04-22 HISTORY — DX: Essential (primary) hypertension: I10

## 2019-04-22 MED ORDER — PAROXETINE HCL 30 MG PO TABS: 30.0000 mg | ORAL_TABLET | Freq: Every day | ORAL | 3 refills | Status: DC

## 2019-04-22 MED ORDER — TRAZODONE HCL 50 MG PO TABS: 25.0000 mg | ORAL_TABLET | Freq: Every evening | ORAL | 3 refills | Status: DC | PRN

## 2019-04-22 MED ORDER — ALPRAZOLAM 0.5 MG PO TABS: 0.5000 mg | ORAL_TABLET | Freq: Two times a day (BID) | ORAL | 0 refills | Status: AC | PRN

## 2019-04-22 MED ORDER — SODIUM CHLORIDE 0.9 % IV SOLN: 500.0000 mL | INTRAVENOUS | 0 refills | Status: DC

## 2019-04-22 NOTE — Progress Notes (Signed)
Chief Complaint  Patient presents with  . 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  . score 29    HPI   Patient is a pleasant 49 year old female who lives with her son who is college a.  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  Over the past 9 months of the pandemic she notes increasing anxiety and worsening physical symptoms.  Since being promoted she has started having chest tightness that initially was self-limited and now can last up to a day at a time resolved only by sleep and starting the next day.  She has trouble getting to sleep and staying asleep.  Usually wakes up around 1:30 in the morning.  Does not feel rested.  Reports that several stressors are contributing to her physical and emotional symptoms including Covid, the election, family.  Her mother has pending spine surgery and a couple of weeks and the patient is going to move in with her to help care for her.  Feels that there are many job responsibilities with the promotion that she was not aware of and require more work than she had initially perceived.  Also, she is stressed to perform to a certain level.  Overall, she says that her motion is good but it is not improved any of her physical and emotional wellbeing.  She has noticed that when she gets anxious and has panic-like symptoms that her heart rate will increase.  Her mother has a pulse ox and recently she checked her pulse on this during 1 of those episodes.  It was elevated from 1 15-1 18.  Her mother encouraged her to seek medical care. Notes from a cardiac standpoint that when her pulse is elevated and she is experiencing extreme anxiety she also gets radiating achiness and pain into her shoulders and deltoids bilaterally.  They feel heavy.  She has experienced flushing of the skin.  In addition, she feels chronically fatigued.  She gets about 6  hours of sleep a night if that.  It is difficult to turn off her mind.  This has worsened since taking a job promotion.   She has had little to no physical activity over the past year due to working at home.  She has smoked for approximately 25 to 30 years about a pack a day.  During Covid and with the increased rest it is actually increased to 1.5 packs a day.  Feels that is the only thing that she can do to relieve her stress.  Does not have time to exercise.  Her intake is worse and she is eating foods that are primarily unhealthy but immediately do provide some comfort.  She noticed that her appetite has increased over the pandemic.  She is also gained about 30 pounds during the pandemic.  She previously has been on an antidepressant, Effexor.  This was about 4 years ago and it was effective for her in relieving anxiety and stress.  Also had a prescription for Xanax for increased anxiety.  Has not  Refilled in quite some time.   Her diet is significant for lots of coffee with cream and sugar, tea, soda.  Tends to be a stress eater.  Probably only drinks about a 20 ounce bottle of water a day and notes that she is probably dehydrated.  Skin and nails are dry.  Hair  loss is typical for her.  She denies any sensitivity to heat or cold.  She has not accessed health care for any health maintenance in the past 5 years including a Pap smear, mammogram, colonoscopy, and/or bone density. The only activity that she is really had over the past 9 months is with her 31-year-old lab who she does take out to walk.  Elevated blood pressure today.  Has not been checking her blood pressure.  She is considering investing in a blood pressure cuff.  What motivated her to come in and establish care today is the worsening physical symptoms with the anxiety, emotional and mental stress.  Her son and mother are somewhat supportive and she has a couple of friends who are supportive.  Has not talked to a  counselor.   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  Family and Social Hx  Family history is significant for hypertension diabetes and lumbar spinal stenosis in her mother.  3 relatives including 2 grandparents and one uncle deceased from lung cancer.  No history of any known autoimmune disease or thyroid disease  Social: See above.  Patient smokes approximately 1.5 packs a day x30 years.  She is experiencing so much stress that smoking cessation will be deferred until the patient is feeling more stable.   Review of Systems    Constitutional: Negative for activity change, appetite change, chills and fever.  HENT: Negative for congestion, nosebleeds, trouble swallowing and voice change.   Respiratory: Negative for cough, shortness of breath and wheezing.   Gastrointestinal: Negative for diarrhea, nausea and vomiting.  Genitourinary: Negative for difficulty urinating, dysuria, flank pain and hematuria.  Musculoskeletal: Negative for back pain, joint swelling and neck pain.  Neurological: Negative for dizziness, speech difficulty, light-headedness and numbness.  See HPI. All other review of systems negative.     Physical Exam:   Physical Examination: General appearance - alert, well appearing, and in no distress and oriented to person, place, and time Mental status - normal mood, behavior, speech, dress, motor activity, and thought processes Eyes - not examined, Visually impaired -Peter's anomaly Neck - supple, no significant adenopathy, carotids upstroke normal bilaterally, no bruits, thyroid exam: thyroid is normal in size without nodules or tenderness Chest - clear to auscultation, no wheezes, rales or rhonchi, symmetric air entry  Heart - normal rate, regular rhythm, normal S1, S2, no murmurs, rubs, clicks or gallops Extremities -  dependent LE edema without clubbing or cyanosis Skin - normal coloration and turgor, no rashes, no suspicious skin lesions noted  No hyperpigmentation of skin.  No current hematomas noted   Lab Review   orders written for new lab studies as appropriate; see orders  Over 3 years ago, had elevated glucose and abnormal CBC.   Marland Kitchen   Assessment & Plan:  Delman Kitten  1. Tachycardia   2. Abnormal CBC   3. Essential hypertension   4. Encounter for screening colonoscopy   5. Encounter for screening mammogram for malignant neoplasm of breast   6. Other fatigue   7. Generalized anxiety disorder with panic attacks   8. Sleep deficient   9. Abnormal weight gain   10. Stress at work   62. Dyspnea on exertion   12. Tobacco user   13. Elevated blood sugar   14. Gastroesophageal reflux disease, unspecified whether esophagitis present  Orders Placed This Encounter  Procedures  . MM Digital Screening  . Thyroid Panel With TSH  . CMP14+EGFR  . CBC with Differential/Platelet  . VITAMIN D 25 Hydroxy (Vit-D Deficiency, Fractures)  . B12 and Folate Panel  . Iron, TIBC and Ferritin Panel  . Urine Microscopic  . Ambulatory referral to Cardiology  . Ambulatory referral to Psychology  . Ambulatory referral to Gastroenterology  . Amb Ref to Medical Weight Management  . Pre: NPO 2 hrs before procedure  . Pre: Verify informed consent  . Pre: Emergency medications  . Pre: ECG and pulse monitoring  . Pre: Monitor NIBP  . Pre: Notify physician  . Pre: Monitor O2 SATs  . Pre: Hypoglycemia protocol  . Post: Keep patient NPO until awake and alert  . Post: Monitor NIBP, ECG & PSAO2  . Post: Vital signs  . Post: Notify physician  . Discharge instructions  . Urinalysis - Glucose/Protein  . EKG 12-Lead  . Pre: Insert peripheral IV  . Discharge home with responsible adult when established discharge criteria is met.    Meds ordered this encounter  Medications  . PARoxetine (PAXIL) 30 MG  tablet    Sig: Take 1 tablet (30 mg total) by mouth daily.    Dispense:  30 tablet    Refill:  3  . ALPRAZolam (XANAX) 0.5 MG tablet    Sig: Take 1 tablet (0.5 mg total) by mouth 2 (two) times daily as needed for anxiety.    Dispense:  30 tablet    Refill:  0  . traZODone (DESYREL) 50 MG tablet    Sig: Take 0.5-1 tablets (25-50 mg total) by mouth at bedtime as needed for sleep.    Dispense:  30 tablet    Refill:  3    Will start medications for anxiety and depression.  Hopefully trazodone can help with sleep.  We discussed meeting with a counselor which is something I have encouraged.  In addition we will start ordering screening for breast cancer and colon cancer which she has not gotten in some time.  She is in line with this plan all questions were answered.  Glyn Ade, NP   A total of 55 minutes were spent face-to-face with the patient during this encountered nd over half of that time was spent on counseling and coordination of care.

## 2019-04-22 NOTE — Patient Instructions (Addendum)
If you have lab work done today you will be contacted with your lab results within the next 2 weeks.  If you have not heard from [place] then please contact us. The fastest way to get your results is to register for My Chart.   IF you received an x-ray today, you will receive an invoice from [facility]. Please contact [facility] at [phone] with questions or concerns regarding your invoice.   IF you received labwork today, you will receive an invoice from [facility]. Please contact [facility] at [phone] with questions or concerns regarding your invoice.   Our billing staff will not be able to assist you with questions regarding bills from these companies.  You will be contacted with the lab results as soon as they are available. The fastest way to get your results is to activate your My Chart account. Instructions are located on the last page of this paperwork. If you have not heard from [place] regarding the results in 2 weeks, please contact this office.     Coping with Quitting Smoking  Quitting smoking is a physical and mental challenge. You will face cravings, withdrawal symptoms, and temptation. Before quitting, work with your health care provider to make a plan that can help you cope. Preparation can help you quit and keep you from giving in. How can I cope with cravings? Cravings usually last for 5-10 minutes. If you get through it, the craving will pass. Consider taking the following actions to help you cope with cravings:  Keep your mouth busy: ? Chew sugar-free gum. ? Suck on hard candies or a straw. ? Brush your teeth.  Keep your hands and body busy: ? Immediately change to a different activity when you feel a craving. ? Squeeze or play with a ball. ? Do an activity or a hobby, like making bead jewelry, practicing needlepoint, or working with wood. ? Mix up your normal routine. ? Take a short exercise break. Go for a quick walk or run up and down  stairs. ? Spend time in public places where smoking is not allowed.  Focus on doing something kind or helpful for someone else.  Call a friend or family member to talk during a craving.  Join a support group.  Call a quit line, such as 1-800-QUIT-NOW.  Talk with your health care provider about medicines that might help you cope with cravings and make quitting easier for you. How can I deal with withdrawal symptoms? Your body may experience negative effects as it tries to get used to not having nicotine in the system. These effects are called withdrawal symptoms. They may include:  Feeling hungrier than normal.  Trouble concentrating.  Irritability.  Trouble sleeping.  Feeling depressed.  Restlessness and agitation.  Craving a cigarette. To manage withdrawal symptoms:  Avoid places, people, and activities that trigger your cravings.  Remember why you want to quit.  Get plenty of sleep.  Avoid coffee and other caffeinated drinks. These may worsen some of your symptoms. How can I handle social situations? Social situations can be difficult when you are quitting smoking, especially in the first few weeks. To manage this, you can:  Avoid parties, bars, and other social situations where people might be smoking.  Avoid alcohol.  Leave right away if you have the urge to smoke.  Explain to your family and friends that you are quitting smoking. Ask for understanding and support.  Plan activities with friends or family where smoking is  not an option. What are some ways I can cope with stress? Wanting to smoke may cause stress, and stress can make you want to smoke. Find ways to manage your stress. Relaxation techniques can help. For example:  Breathe slowly and deeply, in through your nose and out through your mouth.  Listen to soothing, relaxing music.  Talk with a family member or friend about your stress.  Light a candle.  Soak in a bath or take a shower.  Think  about a peaceful place. What are some ways I can prevent weight gain? Be aware that many people gain weight after they quit smoking. However, not everyone does. To keep from gaining weight, have a plan in place before you quit and stick to the plan after you quit. Your plan should include:  Having healthy snacks. When you have a craving, it may help to: ? Eat plain popcorn, crunchy carrots, celery, or other cut vegetables. ? Chew sugar-free gum.  Changing how you eat: ? Eat small portion sizes at meals. ? Eat 4-6 small meals throughout the day instead of 1-2 large meals a day. ? Be mindful when you eat. Do not watch television or do other things that might distract you as you eat.  Exercising regularly: ? Make time to exercise each day. If you do not have time for a long workout, do short bouts of exercise for 5-10 minutes several times a day. ? Do some form of strengthening exercise, like weight lifting, and some form of aerobic exercise, like running or swimming.  Drinking plenty of water or other low-calorie or no-calorie drinks. Drink 6-8 glasses of water daily, or as much as instructed by your health care provider. Summary  Quitting smoking is a physical and mental challenge. You will face cravings, withdrawal symptoms, and temptation to smoke again. Preparation can help you as you go through these challenges.  You can cope with cravings by keeping your mouth busy (such as by chewing gum), keeping your body and hands busy, and making calls to family, friends, or a helpline for people who want to quit smoking.  You can cope with withdrawal symptoms by avoiding places where people smoke, avoiding drinks with caffeine, and getting plenty of rest.  Ask your health care provider about the different ways to prevent weight gain, avoid stress, and handle social situations. This information is not intended to replace advice given to you by your health care provider. Make sure you discuss any  questions you have with your health care provider. Document Revised: 03/13/2017 Document Reviewed: 03/28/2016 Elsevier Patient Education  2020 Elsevier Inc.  Sinus Tachycardia  Sinus tachycardia is a kind of fast heartbeat. In sinus tachycardia, the heart beats more than 100 times a minute. Sinus tachycardia starts in a part of the heart called the sinus node. Sinus tachycardia may be harmless, or it may be a sign of a serious condition. What are the causes? This condition may be caused by:  Exercise or exertion.  A fever.  Pain.  Loss of body fluids (dehydration).  Severe bleeding (hemorrhage).  Anxiety and stress.  Certain substances, including: ? Alcohol. ? Caffeine. ? Tobacco and nicotine products. ? Cold medicines. ? Illegal drugs.  Medical conditions including: ? Heart disease. ? An infection. ? An overactive thyroid (hyperthyroidism). ? A lack of red blood cells (anemia). What are the signs or symptoms? Symptoms of this condition include:  A feeling that the heart is beating quickly (palpitations).  Suddenly noticing your heartbeat (  cardiac awareness).  Dizziness.  Tiredness (fatigue).  Shortness of breath.  Chest pain.  Nausea.  Fainting. How is this diagnosed? This condition is diagnosed with:  A physical exam.  Other tests, such as: ? Blood tests. ? An electrocardiogram (ECG). This test measures the electrical activity of the heart. ? Ambulatory cardiac monitor. This records your heartbeats for 24 hours or more. You may be referred to a heart specialist (cardiologist). How is this treated? Treatment for this condition depends on the cause or the underlying condition. Treatment may involve:  Treating the underlying condition.  Taking new medicines or changing your current medicines as told by your health care provider.  Making changes to your diet or lifestyle. Follow these instructions at home: Lifestyle   Do not use any products  that contain nicotine or tobacco, such as cigarettes and e-cigarettes. If you need help quitting, ask your health care provider.  Do not use illegal drugs, such as cocaine.  Learn relaxation methods to help you when you get stressed or anxious. These include deep breathing.  Avoid caffeine or other stimulants. Alcohol use   Do not drink alcohol if: ? Your health care provider tells you not to drink. ? You are pregnant, may be pregnant, or are planning to become pregnant.  If you drink alcohol, limit how much you have: ? 0-1 drink a day for women. ? 0-2 drinks a day for men.  Be aware of how much alcohol is in your drink. In the [place] one drink equals one typical bottle of beer (12 oz), one-half glass of wine (5 oz), or one shot of hard liquor (1 oz). General instructions  Drink enough fluids to keep your urine pale yellow.  Take over-the-counter and prescription medicines only as told by your health care provider.  Keep all follow-up visits as told by your health care provider. This is important. Contact a health care provider if you have:  A fever.  Vomiting or diarrhea that does not go away. Get help right away if you:  Have pain in your chest, upper arms, jaw, or neck.  Become weak or dizzy.  Feel faint.  Have palpitations that do not go away. Summary  In sinus tachycardia, the heart beats more than 100 times a minute.  Sinus tachycardia may be harmless, or it may be a sign of a serious condition.  Treatment for this condition depends on the cause or the underlying condition.  Get help right away if you have pain in your chest, upper arms, jaw, or neck. This information is not intended to replace advice given to you by your health care provider. Make sure you discuss any questions you have with your health care provider. Document Revised: 05/20/2017 Document Reviewed: 05/20/2017 Elsevier Patient Education  2020 [employer] Inc.  Vitamin D Deficiency Vitamin D  deficiency is when your body does not have enough vitamin D. Vitamin D is important to your body for many reasons:  It helps the body absorb two important minerals--calcium and phosphorus.  It plays a role in bone health.  It may help to prevent some diseases, such as diabetes and multiple sclerosis.  It plays a role in muscle function, including heart function. If vitamin D deficiency is severe, it can cause a condition in which your bones become soft. In adults, this condition is called osteomalacia. In children, this condition is called rickets. What are the causes? This condition may be caused by:  Not eating enough foods that contain vitamin D.  Not getting enough natural sun exposure.  Having certain digestive system diseases that make it difficult for your body to absorb vitamin D. These diseases include Crohn's disease, chronic pancreatitis, and cystic fibrosis.  Having a surgery in which a part of the stomach or a part of the small intestine is removed.  Having chronic kidney disease or liver disease. What increases the risk? You are more likely to develop this condition if you:  Are older.  Do not spend much time outdoors.  Live in a long-term care facility.  Have had broken bones.  Have weak or thin bones (osteoporosis).  Have a disease or condition that changes how the body absorbs vitamin D.  Have dark skin.  Take certain medicines, such as steroid medicines or certain seizure medicines.  Are overweight or obese. What are the signs or symptoms? In mild cases of vitamin D deficiency, there may not be any symptoms. If the condition is severe, symptoms may include:  Bone pain.  Muscle pain.  Falling often.  Broken bones caused by a minor injury. How is this diagnosed? This condition may be diagnosed with blood tests. Imaging tests such as X-rays may also be done to look for changes in the bone. How is this treated? Treatment for this condition may  depend on what caused the condition. Treatment options include:  Taking vitamin D supplements. Your health care provider will suggest what dose is best for you.  Taking a calcium supplement. Your health care provider will suggest what dose is best for you. Follow these instructions at home: Eating and drinking   Eat foods that contain vitamin D. Choices include: ? Fortified dairy products, cereals, or juices. Fortified means that vitamin D has been added to the food. Check the label on the package to see if the food is fortified. ? Fatty fish, such as salmon or trout. ? Eggs. ? Oysters. ? Mushrooms. The items listed above may not be a complete list of recommended foods and beverages. Contact a dietitian for more information. General instructions  Take medicines and supplements only as told by your health care provider.  Get regular, safe exposure to natural sunlight.  Do not use a tanning bed.  Maintain a healthy weight. Lose weight if needed.  Keep all follow-up visits as told by your health care provider. This is important. How is this prevented? You can get vitamin D by:  Eating foods that naturally contain vitamin D.  Eating or drinking products that have been fortified with vitamin D, such as cereals, juices, and dairy products (including milk).  Taking a vitamin D supplement or a multivitamin supplement that contains vitamin D.  Being in the sun. Your body naturally makes vitamin D when your skin is exposed to sunlight. Your body changes the sunlight into a form of the vitamin that it can use. Contact a health care provider if:  Your symptoms do not go away.  You feel nauseous or you vomit.  You have fewer bowel movements than usual or are constipated. Summary  Vitamin D deficiency is when your body does not have enough vitamin D.  Vitamin D is important to your body for good bone health and muscle function, and it may help prevent some diseases.  Vitamin D  deficiency is primarily treated through supplementation. Your health care provider will suggest what dose is best for you.  You can get vitamin D by eating foods that contain vitamin D, by being in the sun, and by taking a  vitamin D supplement or a multivitamin supplement that contains vitamin D. This information is not intended to replace advice given to you by your health care provider. Make sure you discuss any questions you have with your health care provider. Document Revised: 12/07/2017 Document Reviewed: 12/07/2017 Elsevier Patient Education  [place].

## 2019-04-23 LAB — CMP14+EGFR
ALT: 24 IU/L (ref 0–32)
AST: 15 IU/L (ref 0–40)
Albumin/Globulin Ratio: 1.8 (ref 1.2–2.2)
Albumin: 4.5 g/dL (ref 3.8–4.8)
Alkaline Phosphatase: 135 IU/L — ABNORMAL HIGH (ref 39–117)
BUN/Creatinine Ratio: 12 (ref 9–23)
BUN: 10 mg/dL (ref 6–24)
Bilirubin Total: 0.5 mg/dL (ref 0.0–1.2)
CO2: 19 mmol/L — ABNORMAL LOW (ref 20–29)
Calcium: 9.3 mg/dL (ref 8.7–10.2)
Chloride: 104 mmol/L (ref 96–106)
Creatinine, Ser: 0.82 mg/dL (ref 0.57–1.00)
GFR calc Af Amer: 96 mL/min/{1.73_m2} (ref >59)
GFR calc non Af Amer: 84 mL/min/{1.73_m2} (ref >59)
Globulin, Total: 2.5 g/dL (ref 1.5–4.5)
Glucose: 95 mg/dL (ref 65–99)
Potassium: 4.5 mmol/L (ref 3.5–5.2)
Sodium: 139 mmol/L (ref 134–144)
Total Protein: 7 g/dL (ref 6.0–8.5)

## 2019-04-23 LAB — CBC WITH DIFFERENTIAL/PLATELET
Basophils Absolute: 0 10*3/uL (ref 0.0–0.2)
Basos: 0 %
EOS (ABSOLUTE): 0.2 10*3/uL (ref 0.0–0.4)
Eos: 1 %
Hematocrit: 49.3 % — ABNORMAL HIGH (ref 34.0–46.6)
Hemoglobin: 16.9 g/dL — ABNORMAL HIGH (ref 11.1–15.9)
Immature Grans (Abs): 0 10*3/uL (ref 0.0–0.1)
Immature Granulocytes: 0 %
Lymphocytes Absolute: 3.1 10*3/uL (ref 0.7–3.1)
Lymphs: 29 %
MCH: 31.9 pg (ref 26.6–33.0)
MCHC: 34.3 g/dL (ref 31.5–35.7)
MCV: 93 fL (ref 79–97)
Monocytes Absolute: 0.9 10*3/uL (ref 0.1–0.9)
Monocytes: 9 %
Neutrophils Absolute: 6.4 10*3/uL (ref 1.4–7.0)
Neutrophils: 61 %
Platelets: 270 10*3/uL (ref 150–450)
RBC: 5.3 x10E6/uL — ABNORMAL HIGH (ref 3.77–5.28)
RDW: 12.8 % (ref 11.7–15.4)
WBC: 10.6 10*3/uL (ref 3.4–10.8)

## 2019-04-23 LAB — THYROID PANEL WITH TSH
Free Thyroxine Index: 1.7 (ref 1.2–4.9)
T3 Uptake Ratio: 22 % — ABNORMAL LOW (ref 24–39)
T4, Total: 7.7 ug/dL (ref 4.5–12.0)
TSH: 1.29 u[IU]/mL (ref 0.450–4.500)

## 2019-04-27 ENCOUNTER — Telehealth: Payer: Self-pay | Admitting: Internal Medicine

## 2019-04-27 NOTE — Telephone Encounter (Signed)
Can you sign the notes for date 04/22/19 so that I can send referral?  Thanks

## 2019-04-27 NOTE — Progress Notes (Signed)
Cardiology Office Note:   Date:  04/29/2019  NAME:  Meredith Figueroa    MRN: [id number] DOB:  [birth year]   PCP:  Patient, No Pcp Per  Cardiologist:  No primary care provider on file.   Referring MD: Royal Hawthorn, NP   Chief Complaint  Patient presents with  . Chest Pain  . Tachycardia   History of Present Illness:   Meredith Figueroa is a 49 y.o. female with a hx of anxiety who is being seen today for the evaluation of palpitations at the request of Royal Hawthorn, NP.  She presents with multiple complaints for the past 8 months.  She has a longstanding history of anxiety.  She reports intermittently getting tightness in her chest and shortness of breath with activity.  This is occurring with all activity.  She also reports palpitations at times.  The symptoms do not occur all at the same time routinely.  She can have symptoms of chest tightness at times, shortness of breath at times with palpitations at times.  She has symptoms daily from what she tells me.  She also reports shortness of breath with walking short activity.  She has had excessive weight gain.  She reports poor diet and lack of exercise since the pandemic began.  She does consume excess caffeine.  She consumes 5 to 6 cups of coffee per day.  No energy drinks.  She is a smoker and has smoked for 30 years.  She does report excessive nighttime snoring.  She has fatigue during the day.  She does report early morning headaches.  She reports napping frequently as well.  She reports her energy is low.  Primary care physicians recently prescribed anxiety medications as well as sleep aids.  She does have a history of cardiovascular disease in her mother.  She apparently had an arrhythmia.  No other history of coronary heart disease.  Recent thyroid studies normal at PCP office.  TSH 1.29  Past Medical History: Past Medical History:  Diagnosis Date  . Allergy   . Anal skin tag 02/13/2015  . Anxiety   . Essential hypertension  04/22/2019  . HSV infection    twice annually; mouth    Past Surgical History: Past Surgical History:  Procedure Laterality Date  . ABLATION    . TUBAL LIGATION  08/2011    Current Medications: Current Meds  Medication Sig  . ALPRAZolam (XANAX) 0.5 MG tablet Take 1 tablet (0.5 mg total) by mouth 2 (two) times daily as needed for anxiety.  . fluticasone (FLONASE) 50 MCG/ACT nasal spray Place 2 sprays into both nostrils daily.  Marland Kitchen PARoxetine (PAXIL) 30 MG tablet Take 1 tablet (30 mg total) by mouth daily.  . traZODone (DESYREL) 50 MG tablet Take 0.5-1 tablets (25-50 mg total) by mouth at bedtime as needed for sleep.  . valACYclovir (VALTREX) 1000 MG tablet Take 2 tabs twice a day for 1 day for cold sores.     Allergies:    Patient has no known allergies.   Social History: Social History   Socioeconomic History  . Marital status: Divorced    Spouse name: Casaundra Takacs  . Number of children: 1  . Years of education: 87  . Highest education level: Not on file  Occupational History  . Occupation: Marine scientist  Tobacco Use  . Smoking status: Current Every Day Smoker    Packs/day: 1.00    Years: 30.00    Pack years: 30.00    Types:  Cigarettes    Start date: 01/07/1986  . Smokeless tobacco: Never Used  Substance and Sexual Activity  . Alcohol use: Not Currently    Comment: occasional  . Drug use: No  . Sexual activity: Yes    Partners: Male    Birth control/protection: Surgical  Other Topics Concern  . 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 Difficulty of Paying Living Expenses: Not on file  Food Insecurity:   . Worried About Programme researcher, broadcasting/film/video in the Last Year: Not on file  . Ran Out of Food in the Last Year: Not on file  Transportation Needs:   . Lack of Transportation (Medical): Not on file    . Lack of Transportation (Non-Medical): Not on file  Physical Activity:   . Days of Exercise per Week: Not on file  . Minutes of Exercise per Session: Not on file  Stress:   . Feeling of Stress : Not on file  Social Connections:   . Frequency of Communication with Friends and Family: Not on file  . Frequency of Social Gatherings with Friends and Family: Not on file  . Attends Religious Services: Not on file  . Active Member of Clubs or Organizations: Not on file  . Attends Banker Meetings: Not on file  . Marital Status: Not on file     Family History: The patient's family history includes Arthritis in her mother; Cancer in her father; Diabetes in her mother; Heart disease in her mother.  ROS:   All other ROS reviewed and negative. Pertinent positives noted in the HPI.     EKGs/Labs/Other Studies Reviewed:   The following studies were personally reviewed by me today:  EKG:  EKG is ordered today.  The ekg ordered today demonstrates normal sinus rhythm, heart rate 85, old anterior septal infarct, no acute ST-T changes, nonspecific ST-T changes, and was personally reviewed by me.   Recent Labs: 04/22/2019: ALT 24; BUN 10; Creatinine, Ser 0.82; Hemoglobin 16.9; Platelets 270; Potassium 4.5; Sodium 139; TSH 1.290   Recent Lipid Panel No results found for: CHOL, TRIG, HDL, CHOLHDL, VLDL, LDLCALC, LDLDIRECT  Physical Exam:   VS:  BP 130/82   Pulse 85   Temp (!) 97.3 F (36.3 C)   Ht 5' 9.5" (1.765 m)   Wt 250 lb 6.4 oz (113.6 kg)   SpO2 93%   BMI 36.45 kg/m    Wt Readings from Last 3 Encounters:  04/29/19 250 lb 6.4 oz (113.6 kg)  04/22/19 252 lb 9.6 oz (114.6 kg)  05/10/18 237 lb (107.5 kg)    General: Well nourished, well developed, in no acute distress Heart: Atraumatic, normal size  Eyes: PEERLA, EOMI  Neck: Supple, no JVD Endocrine: No thryomegaly Cardiac: Normal S1, S2; RRR; no murmurs, rubs, or gallops Lungs: Clear to auscultation bilaterally, no  wheezing, rhonchi or rales  Abd: Soft, nontender, no hepatomegaly  Ext: No edema, pulses 2+ Musculoskeletal: No deformities, BUE and BLE strength normal and equal Skin: Warm and dry, no rashes   Neuro: Alert and oriented to person, place, time, and situation, CNII-XII grossly intact, no focal deficits  Psych: Normal mood and affect   ASSESSMENT:   Meredith Figueroa is a 49 y.o. female who presents for the following: 1. Palpitations   2. Essential hypertension  3. Tachycardia   4. Tobacco abuse   5. Snoring   6. Fatigue, unspecified type   7. Dyspnea, unspecified type     PLAN:   1. Palpitations/SOB/chest pain -Recent thyroid studies normal.  I suspect most of her symptoms are related to deconditioning and likely untreated sleep apnea.  We will check a BNP and echocardiogram.  She has no evidence of volume overload or congestive heart failure examination.  No evidence of valvular heart disease or murmurs that are concerning.  I have encouraged her to continue exercise and to improve her diet.  We will also proceed with a 3-day ZIO patch to exclude any arrhythmia.  I think some of this could be coming from excess caffeine consumption and she will work on this.  Her cardiovascular disease risk factors include heavy smoking history.  I informed her that we should start with the above work-up and then possibly pursue some sort of stress evaluation.  She is okay with this for now.  2. Essential hypertension -Acceptable today  3. Tachycardia -3-day ZIO patch as above.  TSH normal.  4. Tobacco abuse -Counseled importance of smoking cessation  5. Snoring 6. Fatigue, unspecified type -I recommended a sleep study  7. Dyspnea, unspecified type -BNP and echo as above   Disposition: Return in about 2 months (around 06/27/2019).  Medication Adjustments/Labs and Tests Ordered: Current medicines are reviewed at length with the patient today.  Concerns regarding medicines are outlined above.    Orders Placed This Encounter  Procedures  . B Nat Peptide  . ZIO (3-14 DAYS)  . EKG 12-Lead  . ECHOCARDIOGRAM COMPLETE  . Split night study   No orders of the defined types were placed in this encounter.   Patient Instructions  Medication Instructions:  Your physician recommends that you continue on your current medications as directed. Please refer to the Current Medication list given to you today.  *If you need a refill on your cardiac medications before your next appointment, please call your pharmacy*  Lab Work: BNP test today If you have labs (blood work) drawn today and your tests are completely normal, you will receive your results only by: Marland Kitchen MyChart Message (if you have MyChart) OR . A paper copy in the mail If you have any lab test that is abnormal or we need to change your treatment, we will call you to review the results.  Testing/Procedures: Your physician has requested that you have an echocardiogram. Echocardiography is a painless test that uses sound waves to create images of your heart. It provides your doctor with information about the size and shape of your heart and how well your heart's chambers and valves are working. This procedure takes approximately one hour. There are no restrictions for this procedure. This is done at [address]  Your physician has recommended that you have a sleep study. This test records several body functions during sleep, including: brain activity, eye movement, oxygen and carbon dioxide blood levels, heart rate and rhythm, breathing rate and rhythm, the flow of air through your mouth and nose, snoring, body muscle movements, and chest and belly movement. This will be precertified with your insurance company first and will be either at [facility] or a home sleep study. Mariann Laster is our office sleep coordinator and she will be in touch with you about scheduling this. A COVID19 screening test will be arranged  prior to your sleep study, if done in the sleep  center.    ZIO XT- Long Term Monitor Instructions   Your physician has requested you wear your ZIO patch monitor 3 days.   This is a single patch monitor.  [employer] supplies one patch monitor per enrollment.  Additional stickers are not available.   Please do not apply patch if you will be having a Nuclear Stress Test, Echocardiogram, Cardiac CT, MRI, or Chest Xray during the time frame you would be wearing the monitor. The patch cannot be worn during these tests.  You cannot remove and re-apply the ZIO XT patch monitor.   Your ZIO patch monitor will be sent USPS Priority mail from [facility] directly to your home address. The monitor may also be mailed to a PO BOX if home delivery is not available.   It may take 3-5 days to receive your monitor after you have been enrolled.   Once you have received you monitor, please review enclosed instructions.  Your monitor has already been registered assigning a specific monitor serial # to you.   Applying the monitor   Shave hair from upper left chest.   Hold abrader disc by orange tab.  Rub abrader in 40 strokes over left upper chest as indicated in your monitor instructions.   Clean area with 4 enclosed alcohol pads .  Use all pads to assure are is cleaned thoroughly.  Let dry.   Apply patch as indicated in monitor instructions.  Patch will be place under collarbone on left side of chest with arrow pointing upward.   Rub patch adhesive wings for 2 minutes.Remove white label marked "1".  Remove white label marked "2".  Rub patch adhesive wings for 2 additional minutes.   While looking in a mirror, press and release button in center of patch.  A small green light will flash 3-4 times .  This will be your only indicator the monitor has been turned on.     Do not shower for the first 24 hours.  You may shower after the first 24 hours.   Press button if you feel a symptom. You will hear a  small click.  Record Date, Time and Symptom in the Patient Log Book.   When you are ready to remove patch, follow instructions on last 2 pages of Patient Log Book.  Stick patch monitor onto last page of Patient Log Book.   Place Patient Log Book in [place] box.  Use locking tab on box and tape box closed securely.  The Orange and [employer] box has [employer] postage on it.  Please place in mailbox as soon as possible.  Your physician should have your test results approximately 7 days after the monitor has been mailed back to [facility].   Call [facility] Customer Care at [phone] if you have questions regarding your ZIO XT patch monitor.  Call them immediately if you see an orange light blinking on your monitor.   If your monitor falls off in less than 4 days contact our Monitor department at [phone].  If your monitor becomes loose or falls off after 4 days call [employer] at [phone] for suggestions on securing your monitor.    Follow-Up: At [facility], you and your health needs are our priority.  As part of our continuing mission to provide you with exceptional heart care, we have created designated Provider Care Teams.  These Care Teams include your primary Cardiologist (physician) and Advanced Practice Providers (APPs -  Physician Assistants and Nurse Practitioners) who all work together  to provide you with the care you need, when you need it.  Your next appointment:   2 month(s)  The format for your next appointment:   Virtual Visit   Provider:   Lennie Odor, MD  Other Instructions      Signed, Lenna Gilford. Flora Lipps, MD [facility]  [address], [address] [place], [place] [address] [phone]  04/29/2019 10:23 AM

## 2019-04-28 ENCOUNTER — Encounter: Payer: Self-pay | Admitting: Adult Health Nurse Practitioner

## 2019-04-28 NOTE — Telephone Encounter (Signed)
Please Advise

## 2019-04-29 ENCOUNTER — Telehealth: Payer: Self-pay | Admitting: Radiology

## 2019-04-29 ENCOUNTER — Ambulatory Visit: Payer: Managed Care, Other (non HMO) | Admitting: Cardiovascular Disease

## 2019-04-29 ENCOUNTER — Other Ambulatory Visit: Payer: Self-pay

## 2019-04-29 ENCOUNTER — Encounter: Payer: Self-pay | Admitting: Cardiovascular Disease

## 2019-04-29 VITALS — BP 130/82 | HR 85 | Temp 97.3°F | Ht 69.5 in | Wt 250.4 lb

## 2019-04-29 DIAGNOSIS — I10 Essential (primary) hypertension: Secondary | ICD-10-CM | POA: Diagnosis not present

## 2019-04-29 DIAGNOSIS — R0600 Dyspnea, unspecified: Secondary | ICD-10-CM

## 2019-04-29 DIAGNOSIS — R002 Palpitations: Secondary | ICD-10-CM

## 2019-04-29 DIAGNOSIS — R000 Tachycardia, unspecified: Secondary | ICD-10-CM

## 2019-04-29 DIAGNOSIS — Z720 Tobacco use: Secondary | ICD-10-CM

## 2019-04-29 DIAGNOSIS — R0683 Snoring: Secondary | ICD-10-CM

## 2019-04-29 DIAGNOSIS — R5383 Other fatigue: Secondary | ICD-10-CM

## 2019-04-29 NOTE — Telephone Encounter (Signed)
Enrolled patient for a 3 day Zio monitor to be mailed to patients home.  

## 2019-04-29 NOTE — Patient Instructions (Signed)
Medication Instructions:  Your physician recommends that you continue on your current medications as directed. Please refer to the Current Medication list given to you today.  *If you need a refill on your cardiac medications before your next appointment, please call your pharmacy*  Lab Work: BNP test today If you have labs (blood work) drawn today and your tests are completely normal, you will receive your results only by: Marland Kitchen MyChart Message (if you have MyChart) OR . A paper copy in the mail If you have any lab test that is abnormal or we need to change your treatment, we will call you to review the results.  Testing/Procedures: Your physician has requested that you have an echocardiogram. Echocardiography is a painless test that uses sound waves to create images of your heart. It provides your doctor with information about the size and shape of your heart and how well your heart's chambers and valves are working. This procedure takes approximately one hour. There are no restrictions for this procedure. This is done at [address]  Your physician has recommended that you have a sleep study. This test records several body functions during sleep, including: brain activity, eye movement, oxygen and carbon dioxide blood levels, heart rate and rhythm, breathing rate and rhythm, the flow of air through your mouth and nose, snoring, body muscle movements, and chest and belly movement. This will be precertified with your insurance company first and will be either at [facility] or a home sleep study. Burna Mortimer is our office sleep coordinator and she will be in touch with you about scheduling this. A COVID19 screening test will be arranged prior to your sleep study, if done in the sleep center.    ZIO XT- Long Term Monitor Instructions   Your physician has requested you wear your ZIO patch monitor 3 days.   This is a single patch monitor.  Irhythm supplies one patch  monitor per enrollment.  Additional stickers are not available.   Please do not apply patch if you will be having a Nuclear Stress Test, Echocardiogram, Cardiac CT, MRI, or Chest Xray during the time frame you would be wearing the monitor. The patch cannot be worn during these tests.  You cannot remove and re-apply the ZIO XT patch monitor.   Your ZIO patch monitor will be sent USPS Priority mail from [facility] directly to your home address. The monitor may also be mailed to a PO BOX if home delivery is not available.   It may take 3-5 days to receive your monitor after you have been enrolled.   Once you have received you monitor, please review enclosed instructions.  Your monitor has already been registered assigning a specific monitor serial # to you.   Applying the monitor   Shave hair from upper left chest.   Hold abrader disc by orange tab.  Rub abrader in 40 strokes over left upper chest as indicated in your monitor instructions.   Clean area with 4 enclosed alcohol pads .  Use all pads to assure are is cleaned thoroughly.  Let dry.   Apply patch as indicated in monitor instructions.  Patch will be place under collarbone on left side of chest with arrow pointing upward.   Rub patch adhesive wings for 2 minutes.Remove white label marked "1".  Remove white label marked "2".  Rub patch adhesive wings for 2 additional minutes.   While looking in a mirror, press and release button in  center of patch.  A small green light will flash 3-4 times .  This will be your only indicator the monitor has been turned on.     Do not shower for the first 24 hours.  You may shower after the first 24 hours.   Press button if you feel a symptom. You will hear a small click.  Record Date, Time and Symptom in the Patient Log Book.   When you are ready to remove patch, follow instructions on last 2 pages of Patient Log Book.  Stick patch monitor onto last page of Patient Log Book.   Place Patient  Log Book in [place] box.  Use locking tab on box and tape box closed securely.  The Orange and [employer] has [employer] on it.  Please place in mailbox as soon as possible.  Your physician should have your test results approximately 7 days after the monitor has been mailed back to [facility].   Call [place] at [phone] if you have questions regarding your ZIO XT patch monitor.  Call them immediately if you see an orange light blinking on your monitor.   If your monitor falls off in less than 4 days contact our Monitor department at [phone].  If your monitor becomes loose or falls off after 4 days call [employer] at [phone] for suggestions on securing your monitor.    Follow-Up: At [facility], you and your health needs are our priority.  As part of our continuing mission to provide you with exceptional heart care, we have created designated Provider Care Teams.  These Care Teams include your primary Cardiologist (physician) and Advanced Practice Providers (APPs -  Physician Assistants and Nurse Practitioners) who all work together to provide you with the care you need, when you need it.  Your next appointment:   2 month(s)  The format for your next appointment:   Virtual Visit   Provider:   Eleonore Chiquito, MD  Other Instructions

## 2019-04-30 LAB — BRAIN NATRIURETIC PEPTIDE: BNP: 2.5 pg/mL (ref 0.0–100.0)

## 2019-05-02 NOTE — Telephone Encounter (Signed)
Can you please sign the note from the 04/22/19 encounter so I can send referral please?  Thank you

## 2019-05-03 ENCOUNTER — Telehealth: Payer: Self-pay | Admitting: *Deleted

## 2019-05-03 NOTE — Telephone Encounter (Signed)
Please Advise

## 2019-05-03 NOTE — Telephone Encounter (Signed)
PA submitted to [facility] for in lab sleep study via web portal.

## 2019-05-05 NOTE — Telephone Encounter (Signed)
I cannot close this note as it says I have POCT orders that haven't been resulted yet.  Could you all please help?  Maralyn Sago

## 2019-05-06 NOTE — Telephone Encounter (Signed)
Per Doyne Keel, there were a couple of labs that were added 1hr after previous labs and the lab pool does not go back and look at them. In this case, there were two labs that were added later to the chart. So for future reference, or if this happens, if the pt has already gone,then they would have to be sent to the lab add on pool.   Now you should be able to close chart hopefully.  Thanks,  Christy Gentles

## 2019-05-09 ENCOUNTER — Ambulatory Visit (HOSPITAL_COMMUNITY): Payer: Managed Care, Other (non HMO) | Attending: Cardiology

## 2019-05-09 ENCOUNTER — Other Ambulatory Visit: Payer: Self-pay

## 2019-05-09 DIAGNOSIS — R000 Tachycardia, unspecified: Secondary | ICD-10-CM | POA: Insufficient documentation

## 2019-05-09 DIAGNOSIS — R002 Palpitations: Secondary | ICD-10-CM | POA: Diagnosis present

## 2019-05-09 DIAGNOSIS — R0600 Dyspnea, unspecified: Secondary | ICD-10-CM | POA: Diagnosis not present

## 2019-05-11 ENCOUNTER — Telehealth: Payer: Self-pay | Admitting: *Deleted

## 2019-05-11 ENCOUNTER — Other Ambulatory Visit: Payer: Self-pay | Admitting: Cardiovascular Disease

## 2019-05-11 DIAGNOSIS — I10 Essential (primary) hypertension: Secondary | ICD-10-CM

## 2019-05-11 DIAGNOSIS — R002 Palpitations: Secondary | ICD-10-CM

## 2019-05-11 DIAGNOSIS — R0683 Snoring: Secondary | ICD-10-CM

## 2019-05-11 DIAGNOSIS — R5383 Other fatigue: Secondary | ICD-10-CM

## 2019-05-11 DIAGNOSIS — R0600 Dyspnea, unspecified: Secondary | ICD-10-CM

## 2019-05-11 NOTE — Telephone Encounter (Signed)
Patient notified that [employer] denied in lab sleep study, however approved HST. HST scheduled for 07/08/19 @ 10:00am.

## 2019-05-20 ENCOUNTER — Other Ambulatory Visit: Payer: Self-pay

## 2019-05-20 ENCOUNTER — Ambulatory Visit: Payer: Managed Care, Other (non HMO) | Admitting: Adult Health Nurse Practitioner

## 2019-05-20 ENCOUNTER — Encounter: Payer: Self-pay | Admitting: Adult Health Nurse Practitioner

## 2019-05-20 VITALS — BP 160/90 | HR 83 | Temp 98.0°F | Ht 70.0 in | Wt 249.6 lb

## 2019-05-20 DIAGNOSIS — F410 Panic disorder [episodic paroxysmal anxiety] without agoraphobia: Secondary | ICD-10-CM | POA: Diagnosis not present

## 2019-05-20 DIAGNOSIS — E559 Vitamin D deficiency, unspecified: Secondary | ICD-10-CM | POA: Insufficient documentation

## 2019-05-20 DIAGNOSIS — F411 Generalized anxiety disorder: Secondary | ICD-10-CM | POA: Insufficient documentation

## 2019-05-20 DIAGNOSIS — I10 Essential (primary) hypertension: Secondary | ICD-10-CM | POA: Diagnosis not present

## 2019-05-20 DIAGNOSIS — B009 Herpesviral infection, unspecified: Secondary | ICD-10-CM

## 2019-05-20 LAB — BASIC METABOLIC PANEL
BUN/Creatinine Ratio: 15 (ref 9–23)
BUN: 14 mg/dL (ref 6–24)
CO2: 19 mmol/L — ABNORMAL LOW (ref 20–29)
Calcium: 9.3 mg/dL (ref 8.7–10.2)
Chloride: 106 mmol/L (ref 96–106)
Creatinine, Ser: 0.94 mg/dL (ref 0.57–1.00)
GFR calc Af Amer: 82 mL/min/{1.73_m2} (ref >59)
GFR calc non Af Amer: 71 mL/min/{1.73_m2} (ref >59)
Glucose: 98 mg/dL (ref 65–99)
Potassium: 4.6 mmol/L (ref 3.5–5.2)
Sodium: 139 mmol/L (ref 134–144)

## 2019-05-20 LAB — LIPID PANEL
Chol/HDL Ratio: 5.5 ratio — ABNORMAL HIGH (ref 0.0–4.4)
Cholesterol, Total: 293 mg/dL — ABNORMAL HIGH (ref 100–199)
HDL: 53 mg/dL (ref >39)
LDL Chol Calc (NIH): 218 mg/dL — ABNORMAL HIGH (ref 0–99)
Triglycerides: 120 mg/dL (ref 0–149)
VLDL Cholesterol Cal: 22 mg/dL (ref 5–40)

## 2019-05-20 MED ORDER — VALACYCLOVIR HCL 1 G PO TABS: ORAL_TABLET | ORAL | 6 refills | Status: DC

## 2019-05-20 MED ORDER — FLUTICASONE PROPIONATE 50 MCG/ACT NA SUSP: 2.0000 | Freq: Every day | NASAL | 6 refills | Status: DC

## 2019-05-20 MED ORDER — PAROXETINE HCL 30 MG PO TABS: 30.0000 mg | ORAL_TABLET | Freq: Every day | ORAL | 0 refills | Status: DC

## 2019-05-20 NOTE — Patient Instructions (Signed)
° ° ° °  If you have lab work done today you will be contacted with your lab results within the next 2 weeks.  If you have not heard from us then please contact us. The fastest way to get your results is to register for My Chart. ° ° °IF you received an x-ray today, you will receive an invoice from [facility]. Please contact [facility] at [phone] with questions or concerns regarding your invoice.  ° °IF you received labwork today, you will receive an invoice from [facility]. Please contact [facility] at [phone] with questions or concerns regarding your invoice.  ° °Our billing staff will not be able to assist you with questions regarding bills from these companies. ° °You will be contacted with the lab results as soon as they are available. The fastest way to get your results is to activate your My Chart account. Instructions are located on the last page of this paperwork. If you have not heard from us regarding the results in 2 weeks, please contact this office. °  ° ° ° °

## 2019-05-20 NOTE — Progress Notes (Signed)
Subjective:     Meredith Figueroa is a 49 y.o. female who presents for follow up of anxiety disorder. Current symptoms: none. She denies current suicidal and homicidal ideation. She complains of the following side effects from the treatment: none.  She reports that her anxiety symptoms are much improved.  The Paxil seems to be helping significantly and she is sleeping much better at night with the Trazodone.  Feels she is in a much better place.    The following portions of the patient's history were reviewed and updated as appropriate: allergies, current medications, past family history, past medical history, past surgical history and problem list.    Hypertension:  Patient reports her BP is elevated due to some work related calls she received right as she came in but a second BP is also in the 098J systolic over 19J.  She does not yet have a BP cuff and I have encouraged her to get one.  If readings over the next 6 weeks demonstrate consistent elevation, would recommend starting BP medication which she is amenable to today.     Cardiac:  Had cardiology appointment.  Labs drawn so far from appointment have been normal as was a 3 day holter monitor and echocardiogram.  Suspected deconditioning and caffeine use contributing to palpitations and SOB.     Requests refills on her Valtrex and Flonase.   GAD/PHQ-9   GAD 7 : Generalized Anxiety Score 05/20/2019 04/22/2019  Nervous, Anxious, on Edge 1 0  Control/stop worrying 0 3  Worry too much - different things 0 3  Trouble relaxing 0 2  Restless 0 1  Easily annoyed or irritable 0 2  Afraid - awful might happen 0 1  Total GAD 7 Score 1 12  Anxiety Difficulty Not difficult at all -    PHQ9 SCORE ONLY 05/20/2019 04/22/2019 05/10/2018  Score 0 18 0     ROS  Review of Systems  Constitutional: Negative for activity change, appetite change, chills and fever.  HENT: Negative for congestion, nosebleeds, trouble swallowing and voice change.   Respiratory:  Negative for cough, shortness of breath and wheezing.   Gastrointestinal: Negative for diarrhea, nausea and vomiting.  Genitourinary: Negative for difficulty urinating, dysuria, flank pain and hematuria.  Musculoskeletal: Negative for back pain, joint swelling and neck pain.  Neurological: Negative for dizziness, speech difficulty, light-headedness and numbness.  See HPI. All other review of systems negative.    Objective:    BP (!) 160/90   Pulse 83   Temp 98 F (36.7 C) (Temporal)   Ht 5\' 10"  (1.778 m)   Wt 249 lb 9.6 oz (113.2 kg)   SpO2 94%   BMI 35.81 kg/m   General appearance: alert, well appearing, and in no distress, oriented to person, place, and time and anxious. Chest: clear to auscultation, no wheezes, rales or rhonchi, symmetric air entry.  CVS exam: normal rate, regular rhythm, normal S1, S2, no murmurs, rubs, clicks or gallops. Skin exam - normal coloration and turgor, no rashes, no suspicious skin lesions noted. Mental Status: normal mood, behavior, speech, dress, motor activity, and thought processes, anxious.   Assessment:   1. Generalized anxiety disorder with panic attacks   2. Essential hypertension      Plan:   Orders Placed This Encounter  Procedures  . Lipid panel  . Basic Metabolic Panel   Meds ordered this encounter  Medications  . fluticasone (FLONASE) 50 MCG/ACT nasal spray    Sig: Place 2 sprays  into both nostrils daily.    Dispense:  16 g    Refill:  6  . PARoxetine (PAXIL) 30 MG tablet    Sig: Take 1 tablet (30 mg total) by mouth daily.    Dispense:  90 tablet    Refill:  0  . valACYclovir (VALTREX) 1000 MG tablet    Sig: Take 2 tabs twice a day for 1 day for cold sores.    Dispense:  30 tablet    Refill:  6   F/U 6 weeks for recheck BP and discuss smoking cessation.  All questions were answered and she is inline with this plan.

## 2019-05-27 ENCOUNTER — Other Ambulatory Visit: Payer: Self-pay | Admitting: Adult Health Nurse Practitioner

## 2019-05-27 MED ORDER — SIMVASTATIN 40 MG PO TABS: 20.0000 mg | ORAL_TABLET | Freq: Every day | ORAL | 3 refills | Status: DC

## 2019-06-22 ENCOUNTER — Encounter: Payer: Self-pay | Admitting: Adult Health Nurse Practitioner

## 2019-07-01 ENCOUNTER — Ambulatory Visit: Payer: Managed Care, Other (non HMO) | Admitting: Adult Health Nurse Practitioner

## 2019-07-03 NOTE — Progress Notes (Deleted)
{Choose 1 Note Type (Video or Telephone):[phone]}   The patient was identified using 2 identifiers.  Date:  07/03/2019   ID:  Meredith Figueroa, DOB August 07, 1970, MRN [id number]  {Patient Location:[phone]::"Home"} {Provider [phone]::"Home"}  PCP:  Patient, No Pcp Per  Cardiologist:  No primary care provider on file. *** Electrophysiologist:  None   Evaluation Performed:  {Choose Visit Type:[phone]::"Follow-Up Visit"}  Chief Complaint:  ***  History of Present Illness:    Meredith Figueroa is a 49 y.o. female with obesity, anxiety, HTN, tobacco abuse who presents for follow-up of SOB/palpitations. TSH was normal. Zio patch not completed.   T chol 293, HDL 53, LDL 218, TG 120  Problem List 1. Obesity 2. Anxiety 3. HTN 4. Tobacco abuse   The patient {does/does not:200015} have symptoms concerning for COVID-19 infection (fever, chills, cough, or new shortness of breath).    Past Medical History:  Diagnosis Date  . Allergy   . Anal skin tag 02/13/2015  . Anxiety   . Essential hypertension 04/22/2019  . HSV infection    twice annually; mouth   Past Surgical History:  Procedure Laterality Date  . ABLATION    . TUBAL LIGATION  08/2011     No outpatient medications have been marked as taking for the 07/05/19 encounter (Appointment) with O'Neal, Ronnald Ramp, MD.     Allergies:   Patient has no known allergies.   Social History   Tobacco Use  . Smoking status: Current Every Day Smoker    Packs/day: 1.00    Years: 30.00    Pack years: 30.00    Types: Cigarettes    Start date: 01/07/1986  . Smokeless tobacco: Never Used  Substance Use Topics  . Alcohol use: Not Currently    Comment: occasional  . Drug use: No     Family Hx: The patient's family history includes Arthritis in her mother; Cancer in her father; Diabetes in her mother; Heart disease in her mother.  ROS:   Please see the history of present illness.    *** All other systems reviewed and  are negative.   Prior CV studies:   The following studies were reviewed today:  TTE 05/09/2019 1. Left ventricular ejection fraction, by visual estimation, is 60 to  65%. The left ventricle has normal function. There is no left ventricular  hypertrophy. Normal diastolic function.  2. The left ventricle has no regional wall motion abnormalities.  3. Global right ventricle has normal systolic function.The right  ventricular size is normal. No increase in right ventricular wall  thickness.  4. Left atrial size was normal.  5. Right atrial size was normal.  6. The mitral valve is normal in structure. No evidence of mitral valve  regurgitation. No evidence of mitral stenosis.  7. The tricuspid valve is normal in structure. Tricuspid valve  regurgitation is not demonstrated.  8. The aortic valve is tricuspid. Aortic valve regurgitation is not  visualized. No evidence of aortic valve sclerosis or stenosis.  9. The inferior vena cava is normal in size with greater than 50%  respiratory variability, suggesting right atrial pressure of 3 mmHg.  10. TR signal is inadequate for assessing pulmonary artery systolic  pressure.   Labs/Other Tests and Data Reviewed:    EKG:  {EKG/Telemetry Strips [phone]}  Recent Labs: 04/22/2019: ALT 24; Hemoglobin 16.9; Platelets 270; TSH 1.290 04/29/2019: BNP 2.5 05/20/2019: BUN 14; Creatinine, Ser 0.94; Potassium 4.6; Sodium 139   Recent Lipid Panel Lab Results  Component Value Date/Time  CHOL 293 (H) 05/20/2019 09:13 AM   TRIG 120 05/20/2019 09:13 AM   HDL 53 05/20/2019 09:13 AM   CHOLHDL 5.5 (H) 05/20/2019 09:13 AM   LDLCALC 218 (H) 05/20/2019 09:13 AM    Wt Readings from Last 3 Encounters:  05/20/19 249 lb 9.6 oz (113.2 kg)  04/29/19 250 lb 6.4 oz (113.6 kg)  04/22/19 252 lb 9.6 oz (114.6 kg)     Objective:    Vital Signs:  There were no vitals taken for this visit.   {[facility] Virtual Exam (Optional):[phone]::"VITAL  SIGNS:  reviewed"}  ASSESSMENT & PLAN:    1. ***  COVID-19 Education: The signs and symptoms of COVID-19 were discussed with the patient and how to seek care for testing (follow up with PCP or arrange E-visit).  ***The importance of social distancing was discussed today.  Time:   Today, I have spent *** minutes with the patient with telehealth technology discussing the above problems.     Medication Adjustments/Labs and Tests Ordered: Current medicines are reviewed at length with the patient today.  Concerns regarding medicines are outlined above.   Tests Ordered: No orders of the defined types were placed in this encounter.   Medication Changes: No orders of the defined types were placed in this encounter.   Follow Up:  {F/U Format:[phone]} {follow up:15908}  Signed, Evalina Field, MD  07/03/2019 3:44 PM    [facility]

## 2019-07-05 ENCOUNTER — Telehealth: Payer: Managed Care, Other (non HMO) | Admitting: Cardiovascular Disease

## 2019-07-07 NOTE — Progress Notes (Deleted)
{Choose 1 Note Type (Video or Telephone):[phone]}   The patient was identified using 2 identifiers.  Date:  07/07/2019   ID:  Meredith Figueroa, DOB 20-Aug-1970, MRN [id number]  {Patient Location:[phone]::"Home"} {Provider Location:[phone]::"Home"}  PCP:  Patient, No Pcp Per  Cardiologist:  No primary care provider on file. *** Electrophysiologist:  None   Evaluation Performed:  {Choose Visit Type:[id number]::"Follow-Up Visit"}  Chief Complaint:  ***  History of Present Illness:    Meredith Figueroa is a 49 y.o. female with obesity and tobacco abuse who presents for follow-up of palpitations.  She also was noted to be short of breath.  Her BNP was normal.  Echocardiogram normal.  Thyroid studies are normal most recently.  We did order a sleep study as well as a 3-day Zio patch.  They have not been completed.  Her most recent cholesterol profile is significantly elevated with an LDL cholesterol 218.  This does merit consideration for genetic etiology.  The patient {does/does not:200015} have symptoms concerning for COVID-19 infection (fever, chills, cough, or new shortness of breath).    Past Medical History:  Diagnosis Date  . Allergy   . Anal skin tag 02/13/2015  . Anxiety   . Essential hypertension 04/22/2019  . HSV infection    twice annually; mouth   Past Surgical History:  Procedure Laterality Date  . ABLATION    . TUBAL LIGATION  08/2011     No outpatient medications have been marked as taking for the 07/08/19 encounter (Appointment) with O'Neal, Ronnald Ramp, MD.     Allergies:   Patient has no known allergies.   Social History   Tobacco Use  . Smoking status: Current Every Day Smoker    Packs/day: 1.00    Years: 30.00    Pack years: 30.00    Types: Cigarettes    Start date: 01/07/1986  . Smokeless tobacco: Never Used  Substance Use Topics  . Alcohol use: Not Currently    Comment: occasional  . Drug use: No     Family Hx: The patient's family history  includes Arthritis in her mother; Cancer in her father; Diabetes in her mother; Heart disease in her mother.  ROS:   Please see the history of present illness.    *** All other systems reviewed and are negative.   Prior CV studies:   The following studies were reviewed today:  TTE 05/09/2019 1. Left ventricular ejection fraction, by visual estimation, is 60 to  65%. The left ventricle has normal function. There is no left ventricular  hypertrophy. Normal diastolic function.  2. The left ventricle has no regional wall motion abnormalities.  3. Global right ventricle has normal systolic function.The right  ventricular size is normal. No increase in right ventricular wall  thickness.  4. Left atrial size was normal.  5. Right atrial size was normal.  6. The mitral valve is normal in structure. No evidence of mitral valve  regurgitation. No evidence of mitral stenosis.  7. The tricuspid valve is normal in structure. Tricuspid valve  regurgitation is not demonstrated.  8. The aortic valve is tricuspid. Aortic valve regurgitation is not  visualized. No evidence of aortic valve sclerosis or stenosis.  9. The inferior vena cava is normal in size with greater than 50%  respiratory variability, suggesting right atrial pressure of 3 mmHg.  10. TR signal is inadequate for assessing pulmonary artery systolic  pressure.   Labs/Other Tests and Data Reviewed:    EKG:  {EKG/Telemetry Strips Reviewed:[id number]}  Recent Labs: 04/22/2019: ALT 24; Hemoglobin 16.9; Platelets 270; TSH 1.290 04/29/2019: BNP 2.5 05/20/2019: BUN 14; Creatinine, Ser 0.94; Potassium 4.6; Sodium 139   Recent Lipid Panel Lab Results  Component Value Date/Time   CHOL 293 (H) 05/20/2019 09:13 AM   TRIG 120 05/20/2019 09:13 AM   HDL 53 05/20/2019 09:13 AM   CHOLHDL 5.5 (H) 05/20/2019 09:13 AM   LDLCALC 218 (H) 05/20/2019 09:13 AM    Wt Readings from Last 3 Encounters:  05/20/19 249 lb 9.6 oz (113.2 kg)   04/29/19 250 lb 6.4 oz (113.6 kg)  04/22/19 252 lb 9.6 oz (114.6 kg)     Objective:    Vital Signs:  There were no vitals taken for this visit.   {[facility] Virtual Exam (Optional):[phone]::"VITAL SIGNS:  reviewed"}  ASSESSMENT & PLAN:    1. ***  COVID-19 Education: The signs and symptoms of COVID-19 were discussed with the patient and how to seek care for testing (follow up with PCP or arrange E-visit).  ***The importance of social distancing was discussed today.  Time:   Today, I have spent *** minutes with the patient with telehealth technology discussing the above problems.     Medication Adjustments/Labs and Tests Ordered: Current medicines are reviewed at length with the patient today.  Concerns regarding medicines are outlined above.   Tests Ordered: No orders of the defined types were placed in this encounter.   Medication Changes: No orders of the defined types were placed in this encounter.   Follow Up:  {F/U Format:[phone]} {follow up:15908}  Signed, Evalina Field, MD  07/07/2019 9:51 AM    [facility]

## 2019-07-08 ENCOUNTER — Telehealth: Payer: Managed Care, Other (non HMO) | Admitting: Cardiovascular Disease

## 2019-07-08 ENCOUNTER — Encounter (HOSPITAL_BASED_OUTPATIENT_CLINIC_OR_DEPARTMENT_OTHER): Payer: Managed Care, Other (non HMO) | Admitting: Cardiovascular Disease

## 2019-07-22 NOTE — Telephone Encounter (Signed)
[facility] has not received monitor back from patient. They have attempted to contact the patient on 3 occasions to request monitor be returned to [facility].  CANCELLED ORDER 

## 2019-08-27 ENCOUNTER — Ambulatory Visit: Payer: Managed Care, Other (non HMO) | Attending: Internal Medicine

## 2019-08-27 DIAGNOSIS — Z23 Encounter for immunization: Secondary | ICD-10-CM

## 2019-08-27 NOTE — Progress Notes (Signed)
   Covid-19 Vaccination Clinic  Name:  Meredith Figueroa    MRN: [id number] DOB: 06-24-1970  08/27/2019  [name] was observed post Covid-19 immunization for 15 minutes without incident. She was provided with Vaccine Information Sheet and instruction to access the V-Safe system.   [name] was instructed to call 911 with any severe reactions post vaccine: Marland Kitchen Difficulty breathing  . Swelling of face and throat  . A fast heartbeat  . A bad rash all over body  . Dizziness and weakness   Immunizations Administered    Name Date Dose VIS Date Route   Pfizer COVID-19 Vaccine 08/27/2019  8:42 AM 0.3 mL 06/08/2018 Intramuscular   Manufacturer: [employer], [employer]   Lot: [id number]   NDC: [address]-1

## 2019-09-19 ENCOUNTER — Ambulatory Visit: Payer: Managed Care, Other (non HMO)

## 2019-09-19 ENCOUNTER — Ambulatory Visit: Payer: Managed Care, Other (non HMO) | Attending: Internal Medicine

## 2019-09-19 DIAGNOSIS — Z23 Encounter for immunization: Secondary | ICD-10-CM

## 2019-09-19 NOTE — Progress Notes (Signed)
   Covid-19 Vaccination Clinic  Name:  CHANDREA ZELLMAN    MRN: [id number] DOB: December 28, 1970  09/19/2019  Ms. Duzan was observed post Covid-19 immunization for 15 minutes without incident. She was provided with Vaccine Information Sheet and instruction to access the V-Safe system.   Ms. Brandenburger was instructed to call 911 with any severe reactions post vaccine: Marland Kitchen Difficulty breathing  . Swelling of face and throat  . A fast heartbeat  . A bad rash all over body  . Dizziness and weakness   Immunizations Administered    Name Date Dose VIS Date Route   Pfizer COVID-19 Vaccine 09/19/2019  4:03 PM 0.3 mL 06/08/2018 Intramuscular   Manufacturer: [employer], [employer]   Lot: [id number]   NDC: [address]-4

## 2019-10-21 ENCOUNTER — Ambulatory Visit: Payer: Managed Care, Other (non HMO) | Admitting: Registered Nurse

## 2019-10-24 ENCOUNTER — Other Ambulatory Visit: Payer: Self-pay

## 2019-10-24 ENCOUNTER — Encounter: Payer: Self-pay | Admitting: Registered Nurse

## 2019-10-24 ENCOUNTER — Ambulatory Visit: Payer: Managed Care, Other (non HMO) | Admitting: Registered Nurse

## 2019-10-24 VITALS — BP 134/86 | HR 77 | Temp 98.0°F | Resp 17 | Ht 70.0 in | Wt 247.0 lb

## 2019-10-24 DIAGNOSIS — F411 Generalized anxiety disorder: Secondary | ICD-10-CM | POA: Diagnosis not present

## 2019-10-24 DIAGNOSIS — R7989 Other specified abnormal findings of blood chemistry: Secondary | ICD-10-CM

## 2019-10-24 DIAGNOSIS — E782 Mixed hyperlipidemia: Secondary | ICD-10-CM

## 2019-10-24 DIAGNOSIS — F410 Panic disorder [episodic paroxysmal anxiety] without agoraphobia: Secondary | ICD-10-CM

## 2019-10-24 MED ORDER — ALPRAZOLAM 0.25 MG PO TABS: 0.2500 mg | ORAL_TABLET | Freq: Two times a day (BID) | ORAL | 0 refills | Status: DC | PRN

## 2019-10-24 MED ORDER — PAROXETINE HCL 40 MG PO TABS: 40.0000 mg | ORAL_TABLET | ORAL | 1 refills | Status: DC

## 2019-10-24 MED ORDER — TRAZODONE HCL 50 MG PO TABS: 25.0000 mg | ORAL_TABLET | Freq: Every evening | ORAL | 3 refills | Status: DC | PRN

## 2019-10-24 NOTE — Patient Instructions (Signed)
° ° ° °  If you have lab work done today you will be contacted with your lab results within the next 2 weeks.  If you have not heard from us then please contact us. The fastest way to get your results is to register for My Chart. ° ° °IF you received an x-ray today, you will receive an invoice from [facility]. Please contact [facility] at [phone] with questions or concerns regarding your invoice.  ° °IF you received labwork today, you will receive an invoice from [facility]. Please contact [facility] at [phone] with questions or concerns regarding your invoice.  ° °Our billing staff will not be able to assist you with questions regarding bills from these companies. ° °You will be contacted with the lab results as soon as they are available. The fastest way to get your results is to activate your My Chart account. Instructions are located on the last page of this paperwork. If you have not heard from us regarding the results in 2 weeks, please contact this office. °  ° ° ° °

## 2019-10-25 LAB — CBC
Hematocrit: 46.4 % (ref 34.0–46.6)
Hemoglobin: 16 g/dL — ABNORMAL HIGH (ref 11.1–15.9)
MCH: 32.6 pg (ref 26.6–33.0)
MCHC: 34.5 g/dL (ref 31.5–35.7)
MCV: 95 fL (ref 79–97)
Platelets: 225 10*3/uL (ref 150–450)
RBC: 4.91 x10E6/uL (ref 3.77–5.28)
RDW: 13.1 % (ref 11.7–15.4)
WBC: 10.2 10*3/uL (ref 3.4–10.8)

## 2019-10-25 LAB — LIPID PANEL
Chol/HDL Ratio: 5.2 ratio — ABNORMAL HIGH (ref 0.0–4.4)
Cholesterol, Total: 265 mg/dL — ABNORMAL HIGH (ref 100–199)
HDL: 51 mg/dL (ref >39)
LDL Chol Calc (NIH): 186 mg/dL — ABNORMAL HIGH (ref 0–99)
Triglycerides: 150 mg/dL — ABNORMAL HIGH (ref 0–149)
VLDL Cholesterol Cal: 28 mg/dL (ref 5–40)

## 2019-10-27 ENCOUNTER — Encounter: Payer: Self-pay | Admitting: Registered Nurse

## 2020-01-06 ENCOUNTER — Encounter: Payer: Self-pay | Admitting: *Deleted

## 2020-01-09 ENCOUNTER — Encounter: Payer: Self-pay | Admitting: Registered Nurse

## 2020-01-09 NOTE — Progress Notes (Signed)
Established Patient Office Visit  Subjective:  Patient ID: Meredith Figueroa, female    DOB: 1970/09/11  Age: 49 y.o. MRN: [id number]  CC:  Chief Complaint  Patient presents with  . Medication Refill    Patient needs refill on the medications pended. Per patient she would like to get a 3 month supply due to cost and she has no other questions or concerns at this time.    HPI Meredith Figueroa presents for med refill on paroxetine, alprazolam, and trazodone  Taking these for hx of anxiety. Good effect. Has anxiety under control in general. Few breakthroughs, infrequent alprazolam use. No AEs noted. Happy with this regimen.  No other concerns at this time.   Past Medical History:  Diagnosis Date  . Allergy   . Anal skin tag 02/13/2015  . Anxiety   . Essential hypertension 04/22/2019  . HSV infection    twice annually; mouth    Past Surgical History:  Procedure Laterality Date  . ABLATION    . TUBAL LIGATION  08/2011    Family History  Problem Relation Age of Onset  . Diabetes Mother   . Arthritis Mother   . Heart disease Mother   . Cancer Father     Social History   Socioeconomic History  . Marital status: Divorced    Spouse name: Clemie General  . Number of children: 1  . Years of education: 49  . Highest education level: Not on file  Occupational History  . Occupation: Marine scientist  Tobacco Use  . Smoking status: Current Every Day Smoker    Packs/day: 1.00    Years: 30.00    Pack years: 30.00    Types: Cigarettes    Start date: 01/07/1986  . Smokeless tobacco: Never Used  Substance and Sexual Activity  . Alcohol use: Not Currently    Comment: occasional  . Drug use: No  . Sexual activity: Yes    Partners: Male    Birth control/protection: Surgical  Other Topics Concern  . 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 Difficulty of Paying Living Expenses: Not on file  Food Insecurity:   . Worried About Programme researcher, broadcasting/film/video in the Last Year: Not on file  . Ran Out of Food in the Last Year: Not on file  Transportation Needs:   . Lack of Transportation (Medical): Not on file  . Lack of Transportation (Non-Medical): Not on file  Physical Activity:   . Days of Exercise per Week: Not on file  . Minutes of Exercise per Session: Not on file  Stress:   . Feeling of Stress : Not on file  Social Connections:   . Frequency of Communication with Friends and Family: Not on file  . Frequency of Social Gatherings with Friends and Family: Not on file  . Attends Religious Services: Not on file  . Active Member of Clubs or Organizations: Not on file  . Attends Banker Meetings: Not on file  . Marital Status: Not on file  Intimate Partner Violence:   . Fear of Current or Ex-Partner: Not on file  . Emotionally Abused: Not on file  . Physically Abused: Not on file  . Sexually Abused: Not on file    Outpatient Medications Prior to Visit  Medication Sig  Dispense Refill  . fluticasone (FLONASE) 50 MCG/ACT nasal spray Place 2 sprays into both nostrils daily. 16 g 6  . valACYclovir (VALTREX) 1000 MG tablet Take 2 tabs twice a day for 1 day for cold sores. 30 tablet 6  . traZODone (DESYREL) 50 MG tablet Take 0.5-1 tablets (25-50 mg total) by mouth at bedtime as needed for sleep. 30 tablet 3  . simvastatin (ZOCOR) 40 MG tablet Take 0.5 tablets (20 mg total) by mouth at bedtime. 30 tablet 3  . PARoxetine (PAXIL) 30 MG tablet Take 1 tablet (30 mg total) by mouth daily. 90 tablet 0   No facility-administered medications prior to visit.    No Known Allergies  ROS Review of Systems  Constitutional: Negative.   HENT: Negative.   Eyes: Negative.   Respiratory: Negative.   Cardiovascular: Negative.   Gastrointestinal: Negative.   Genitourinary: Negative.    Musculoskeletal: Negative.   Skin: Negative.   Neurological: Negative.   Psychiatric/Behavioral: Negative.   All other systems reviewed and are negative.     Objective:    Physical Exam Vitals and nursing note reviewed.  Constitutional:      General: She is not in acute distress.    Appearance: Normal appearance. She is normal weight. She is not ill-appearing, toxic-appearing or diaphoretic.  Cardiovascular:     Rate and Rhythm: Normal rate and regular rhythm.     Heart sounds: Normal heart sounds. No murmur heard.  No friction rub. No gallop.   Pulmonary:     Effort: Pulmonary effort is normal. No respiratory distress.     Breath sounds: Normal breath sounds. No stridor. No wheezing, rhonchi or rales.  Chest:     Chest wall: No tenderness.  Skin:    General: Skin is warm and dry.  Neurological:     General: No focal deficit present.     Mental Status: She is alert and oriented to person, place, and time. Mental status is at baseline.  Psychiatric:        Mood and Affect: Mood normal.        Behavior: Behavior normal.        Thought Content: Thought content normal.        Judgment: Judgment normal.     BP 134/86   Pulse 77   Temp 98 F (36.7 C) (Temporal)   Resp 17   Ht 5\' 10"  (1.778 m)   Wt 247 lb (112 kg)   SpO2 96%   BMI 35.44 kg/m  Wt Readings from Last 3 Encounters:  10/24/19 247 lb (112 kg)  05/20/19 249 lb 9.6 oz (113.2 kg)  04/29/19 250 lb 6.4 oz (113.6 kg)     Health Maintenance Due  Topic Date Due  . PAP SMEAR-Modifier  Never done  . INFLUENZA VACCINE  Never done    There are no preventive care reminders to display for this patient.  Lab Results  Component Value Date   TSH 1.290 04/22/2019   Lab Results  Component Value Date   WBC 10.2 10/24/2019   HGB 16.0 (H) 10/24/2019   HCT 46.4 10/24/2019   MCV 95 10/24/2019   PLT 225 10/24/2019   Lab Results  Component Value Date   NA 139 05/20/2019   K 4.6 05/20/2019   CO2 19 (L)  05/20/2019   GLUCOSE 98 05/20/2019   BUN 14 05/20/2019   CREATININE 0.94 05/20/2019   BILITOT 0.5 04/22/2019   ALKPHOS 135 (H) 04/22/2019   AST 15 04/22/2019  ALT 24 04/22/2019   PROT 7.0 04/22/2019   ALBUMIN 4.5 04/22/2019   CALCIUM 9.3 05/20/2019   Lab Results  Component Value Date   CHOL 265 (H) 10/24/2019   Lab Results  Component Value Date   HDL 51 10/24/2019   Lab Results  Component Value Date   LDLCALC 186 (H) 10/24/2019   Lab Results  Component Value Date   TRIG 150 (H) 10/24/2019   Lab Results  Component Value Date   CHOLHDL 5.2 (H) 10/24/2019   Lab Results  Component Value Date   HGBA1C 5.5 04/17/2016      Assessment & Plan:   Problem List Items Addressed This Visit      Other   Generalized anxiety disorder with panic attacks - Primary (Chronic)   Relevant Medications   traZODone (DESYREL) 50 MG tablet   ALPRAZolam (XANAX) 0.25 MG tablet   PARoxetine (PAXIL) 40 MG tablet   Abnormal CBC   Relevant Orders   CBC (Completed)    Other Visit Diagnoses    Mixed hyperlipidemia       Relevant Orders   Lipid panel (Completed)      Meds ordered this encounter  Medications  . traZODone (DESYREL) 50 MG tablet    Sig: Take 0.5-1 tablets (25-50 mg total) by mouth at bedtime as needed for sleep.    Dispense:  90 tablet    Refill:  3  . ALPRAZolam (XANAX) 0.25 MG tablet    Sig: Take 1 tablet (0.25 mg total) by mouth 2 (two) times daily as needed for anxiety.    Dispense:  60 tablet    Refill:  0    Order Specific Question:   Supervising Provider    Answer:   Neva Seat, JEFFREY R [[id number]]  . PARoxetine (PAXIL) 40 MG tablet    Sig: Take 1 tablet (40 mg total) by mouth every morning.    Dispense:  90 tablet    Refill:  1    Order Specific Question:   Supervising Provider    Answer:   Neva Seat, JEFFREY R [[id number]]    Follow-up: No follow-ups on file.   PLAN  Hx of abnormal labs - will repeat cbc and cmp today  Refills sent  Return in 6 mo  Patient  encouraged to call clinic with any questions, comments, or concerns.  Janeece Agee, NP

## 2020-07-04 ENCOUNTER — Other Ambulatory Visit: Payer: Self-pay

## 2020-07-04 ENCOUNTER — Ambulatory Visit: Payer: Managed Care, Other (non HMO) | Admitting: Registered Nurse

## 2020-07-04 ENCOUNTER — Encounter: Payer: Self-pay | Admitting: Registered Nurse

## 2020-07-04 VITALS — BP 132/86 | HR 86 | Temp 98.0°F | Resp 16 | Ht 70.0 in | Wt 241.0 lb

## 2020-07-04 DIAGNOSIS — J309 Allergic rhinitis, unspecified: Secondary | ICD-10-CM

## 2020-07-04 DIAGNOSIS — B009 Herpesviral infection, unspecified: Secondary | ICD-10-CM | POA: Diagnosis not present

## 2020-07-04 DIAGNOSIS — F411 Generalized anxiety disorder: Secondary | ICD-10-CM | POA: Diagnosis not present

## 2020-07-04 DIAGNOSIS — F410 Panic disorder [episodic paroxysmal anxiety] without agoraphobia: Secondary | ICD-10-CM

## 2020-07-04 DIAGNOSIS — E782 Mixed hyperlipidemia: Secondary | ICD-10-CM | POA: Diagnosis not present

## 2020-07-04 DIAGNOSIS — Z1329 Encounter for screening for other suspected endocrine disorder: Secondary | ICD-10-CM

## 2020-07-04 DIAGNOSIS — Z130 Encounter for screening for diseases of the blood and blood-forming organs and certain disorders involving the immune mechanism: Secondary | ICD-10-CM

## 2020-07-04 DIAGNOSIS — Z13228 Encounter for screening for other metabolic disorders: Secondary | ICD-10-CM

## 2020-07-04 MED ORDER — VALACYCLOVIR HCL 1 G PO TABS: ORAL_TABLET | ORAL | 6 refills | Status: DC

## 2020-07-04 MED ORDER — ALPRAZOLAM 0.25 MG PO TABS: 0.2500 mg | ORAL_TABLET | Freq: Two times a day (BID) | ORAL | 0 refills | Status: DC | PRN

## 2020-07-04 MED ORDER — TRAZODONE HCL 50 MG PO TABS: 25.0000 mg | ORAL_TABLET | Freq: Every evening | ORAL | 3 refills | Status: DC | PRN

## 2020-07-04 MED ORDER — PAROXETINE HCL 40 MG PO TABS: 40.0000 mg | ORAL_TABLET | ORAL | 3 refills | Status: DC

## 2020-07-04 MED ORDER — SIMVASTATIN 40 MG PO TABS: 20.0000 mg | ORAL_TABLET | Freq: Every day | ORAL | 3 refills | Status: DC

## 2020-07-04 MED ORDER — FLUTICASONE PROPIONATE 50 MCG/ACT NA SUSP: 2.0000 | Freq: Every day | NASAL | 6 refills | Status: DC

## 2020-07-04 NOTE — Progress Notes (Signed)
Established Patient Office Visit  Subjective:  Patient ID: Meredith Figueroa, female    DOB: 05/12/1970  Age: 50 y.o. MRN: [id number]  CC:  Chief Complaint  Patient presents with  . Medication Refill    Pt would a refill of xanax, flonase, paxil, zocor, and valtrex    HPI Meredith Figueroa presents for medication refill - all meds  Has run out for a few weeks:  Anxiety and depression: paxil 40mg  PO qd with alprazolam 0.25mg  PO bid PRN for breakthrough anxiety. 60 tabs alprazolam lasts more than 1 year. Doesn't use trazodone often but has 25-50mg  PO qhs PRN for sleep.   Hyperlipidemia  Last labs reviewed, improved from previous but still elevated Has been out but usually takes simvastatin 20mg  PO qd with dinner No AEs Has tried to keep an eye on diet and stay active.   Rhinitis: flonase PRN  Cold sore: Takes valtrex 1g PO bid episodically. No AEs.  Past Medical History:  Diagnosis Date  . Allergy   . Anal skin tag 02/13/2015  . Anxiety   . Essential hypertension 04/22/2019  . HSV infection    twice annually; mouth    Past Surgical History:  Procedure Laterality Date  . ABLATION    . TUBAL LIGATION  08/2011    Family History  Problem Relation Age of Onset  . Diabetes Mother   . Arthritis Mother   . Heart disease Mother   . Cancer Father     Social History   Socioeconomic History  . Marital status: Divorced    Spouse name: Marikay AlarDavid Santor  . Number of children: 1  . Years of education: 7014  . Highest education level: Not on file  Occupational History  . Occupation: Marine scientistinsurance underwriter  Tobacco Use  . Smoking status: Current Every Day Smoker    Packs/day: 1.00    Years: 30.00    Pack years: 30.00    Types: Cigarettes    Start date: 01/07/1986  . Smokeless tobacco: Never Used  Substance and Sexual Activity  . Alcohol use: Not Currently    Comment: occasional  . Drug use: No  . Sexual activity: Yes    Partners: Male    Birth control/protection: Surgical   Other Topics Concern  . Not on file  Social History Narrative   Divorced, one son she is an Marine scientistinsurance underwriter and has 3 caffeinated beverages daily   02/13/2015   Social Determinants of Health   Financial Resource Strain: Not on file  Food Insecurity: Not on file  Transportation Needs: Not on file  Physical Activity: Not on file  Stress: Not on file  Social Connections: Not on file  Intimate Partner Violence: Not on file    Outpatient Medications Prior to Visit  Medication Sig Dispense Refill  . ALPRAZolam (XANAX) 0.25 MG tablet Take 1 tablet (0.25 mg total) by mouth 2 (two) times daily as needed for anxiety. 60 tablet 0  . fluticasone (FLONASE) 50 MCG/ACT nasal spray Place 2 sprays into both nostrils daily. 16 g 6  . PARoxetine (PAXIL) 40 MG tablet Take 1 tablet (40 mg total) by mouth every morning. 90 tablet 1  . traZODone (DESYREL) 50 MG tablet Take 0.5-1 tablets (25-50 mg total) by mouth at bedtime as needed for sleep. 90 tablet 3  . valACYclovir (VALTREX) 1000 MG tablet Take 2 tabs twice a day for 1 day for cold sores. 30 tablet 6  . simvastatin (ZOCOR) 40 MG tablet Take 0.5 tablets (  20 mg total) by mouth at bedtime. 30 tablet 3   No facility-administered medications prior to visit.    No Known Allergies  ROS Review of Systems  Constitutional: Negative.   HENT: Negative.   Eyes: Negative.   Respiratory: Negative.   Cardiovascular: Negative.   Gastrointestinal: Negative.   Genitourinary: Negative.   Musculoskeletal: Negative.   Skin: Negative.   Neurological: Negative.   Psychiatric/Behavioral: Negative.   All other systems reviewed and are negative.     Objective:    Physical Exam Vitals and nursing note reviewed.  Constitutional:      General: She is not in acute distress.    Appearance: Normal appearance. She is normal weight. She is not ill-appearing, toxic-appearing or diaphoretic.  Cardiovascular:     Rate and Rhythm: Normal rate and regular  rhythm.     Heart sounds: Normal heart sounds. No murmur heard. No friction rub. No gallop.   Pulmonary:     Effort: Pulmonary effort is normal. No respiratory distress.     Breath sounds: Normal breath sounds. No stridor. No wheezing, rhonchi or rales.  Chest:     Chest wall: No tenderness.  Skin:    General: Skin is warm and dry.  Neurological:     General: No focal deficit present.     Mental Status: She is alert and oriented to person, place, and time. Mental status is at baseline.  Psychiatric:        Mood and Affect: Mood normal.        Behavior: Behavior normal.        Thought Content: Thought content normal.        Judgment: Judgment normal.     BP (!) 166/91   Pulse 86   Temp 98 F (36.7 C) (Temporal)   Resp 16   Ht 5\' 10"  (1.778 m)   Wt 241 lb (109.3 kg)   SpO2 98%   BMI 34.58 kg/m  Wt Readings from Last 3 Encounters:  07/04/20 241 lb (109.3 kg)  10/24/19 247 lb (112 kg)  05/20/19 249 lb 9.6 oz (113.2 kg)     Health Maintenance Due  Topic Date Due  . PAP SMEAR-Modifier  Never done  . COLONOSCOPY (Pts 45-43yrs Insurance coverage will need to be confirmed)  Never done  . MAMMOGRAM  Never done  . COVID-19 Vaccine (3 - Pfizer risk 4-dose series) 10/17/2019  . INFLUENZA VACCINE  Never done    There are no preventive care reminders to display for this patient.  Lab Results  Component Value Date   TSH 1.290 04/22/2019   Lab Results  Component Value Date   WBC 10.2 10/24/2019   HGB 16.0 (H) 10/24/2019   HCT 46.4 10/24/2019   MCV 95 10/24/2019   PLT 225 10/24/2019   Lab Results  Component Value Date   NA 139 05/20/2019   K 4.6 05/20/2019   CO2 19 (L) 05/20/2019   GLUCOSE 98 05/20/2019   BUN 14 05/20/2019   CREATININE 0.94 05/20/2019   BILITOT 0.5 04/22/2019   ALKPHOS 135 (H) 04/22/2019   AST 15 04/22/2019   ALT 24 04/22/2019   PROT 7.0 04/22/2019   ALBUMIN 4.5 04/22/2019   CALCIUM 9.3 05/20/2019   Lab Results  Component Value Date   CHOL  265 (H) 10/24/2019   Lab Results  Component Value Date   HDL 51 10/24/2019   Lab Results  Component Value Date   LDLCALC 186 (H) 10/24/2019   Lab Results  Component Value Date   TRIG 150 (H) 10/24/2019   Lab Results  Component Value Date   CHOLHDL 5.2 (H) 10/24/2019   Lab Results  Component Value Date   HGBA1C 5.5 04/17/2016      Assessment & Plan:   Problem List Items Addressed This Visit      Other   Generalized anxiety disorder with panic attacks (Chronic)   Relevant Medications   PARoxetine (PAXIL) 40 MG tablet   traZODone (DESYREL) 50 MG tablet   ALPRAZolam (XANAX) 0.25 MG tablet   HSV infection   Relevant Medications   valACYclovir (VALTREX) 1000 MG tablet    Other Visit Diagnoses    Allergic rhinitis, unspecified seasonality, unspecified trigger    -  Primary   Relevant Medications   fluticasone (FLONASE) 50 MCG/ACT nasal spray   Mixed hyperlipidemia       Relevant Medications   simvastatin (ZOCOR) 40 MG tablet   Other Relevant Orders   CBC With Differential   Hemoglobin A1c   Comprehensive metabolic panel   Lipid panel   TSH   Screening for endocrine, metabolic and immunity disorder       Relevant Orders   CBC With Differential   Hemoglobin A1c   Comprehensive metabolic panel   Lipid panel   TSH      Meds ordered this encounter  Medications  . PARoxetine (PAXIL) 40 MG tablet    Sig: Take 1 tablet (40 mg total) by mouth every morning.    Dispense:  90 tablet    Refill:  3    Order Specific Question:   Supervising Provider    Answer:   Neva Seat, JEFFREY R [[id number]]  . traZODone (DESYREL) 50 MG tablet    Sig: Take 0.5-1 tablets (25-50 mg total) by mouth at bedtime as needed for sleep.    Dispense:  90 tablet    Refill:  3    Order Specific Question:   Supervising Provider    Answer:   Neva Seat, JEFFREY R [[id number]]  . simvastatin (ZOCOR) 40 MG tablet    Sig: Take 0.5 tablets (20 mg total) by mouth at bedtime.    Dispense:  90 tablet    Refill:   3    Order Specific Question:   Supervising Provider    Answer:   Neva Seat, JEFFREY R [[id number]]  . valACYclovir (VALTREX) 1000 MG tablet    Sig: Take 2 tabs twice a day for 1 day for cold sores.    Dispense:  30 tablet    Refill:  6    Order Specific Question:   Supervising Provider    Answer:   Neva Seat, JEFFREY R [[id number]]  . fluticasone (FLONASE) 50 MCG/ACT nasal spray    Sig: Place 2 sprays into both nostrils daily.    Dispense:  16 g    Refill:  6    Order Specific Question:   Supervising Provider    Answer:   Neva Seat, JEFFREY R [[id number]]  . ALPRAZolam (XANAX) 0.25 MG tablet    Sig: Take 1 tablet (0.25 mg total) by mouth 2 (two) times daily as needed for anxiety.    Dispense:  60 tablet    Refill:  0    Order Specific Question:   Supervising Provider    Answer:   Neva Seat, JEFFREY R [[id number]]    Follow-up: No follow-ups on file.   PLAN  BP improved on recheck  Refill meds as above  Suggest restarting paxil with half dose for a few  days then increasing to full dose  Lab orders sent, discussed with patient where she can have these drawn  Patient encouraged to call clinic with any questions, comments, or concerns.  Janeece Agee, NP

## 2021-08-12 ENCOUNTER — Ambulatory Visit: Payer: Managed Care, Other (non HMO) | Admitting: Registered Nurse

## 2021-08-12 ENCOUNTER — Encounter: Payer: Self-pay | Admitting: Registered Nurse

## 2021-08-12 VITALS — BP 142/82 | HR 84 | Temp 97.6°F | Resp 18 | Ht 70.0 in | Wt 235.0 lb

## 2021-08-12 DIAGNOSIS — M7701 Medial epicondylitis, right elbow: Secondary | ICD-10-CM | POA: Diagnosis not present

## 2021-08-12 MED ORDER — DICLOFENAC SODIUM 75 MG PO TBEC: 75.0000 mg | DELAYED_RELEASE_TABLET | Freq: Two times a day (BID) | ORAL | 0 refills | Status: DC

## 2021-08-12 NOTE — Progress Notes (Signed)
? ?Established Patient Office Visit ? ?Subjective:  ?Patient ID: Meredith Figueroa, female    DOB: 12/21/1970  Age: 51 y.o. MRN: [id number] ? ?CC:  ?Chief Complaint  ?Patient presents with  ? Arm Pain  ?  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 She is also having trouble sleeping  ? ? ?HPI ?Meredith Figueroa presents for arm pain, sleep disturbance ? ?R arm pain ?Onset 6 days ago, she is R hand dominant ?Feels like it originals in ulnar aspect wrist and travels up towards elbow ?She does sleep on her right side. She does type a lot. She has done a lot of pruning  ?Does note some intermittent. tingling in fingertips ? ?Outpatient Medications Prior to Visit  ?Medication Sig Dispense Refill  ? ALPRAZolam (XANAX) 0.25 MG tablet Take 1 tablet (0.25 mg total) by mouth 2 (two) times daily as needed for anxiety. 60 tablet 0  ? fluticasone (FLONASE) 50 MCG/ACT nasal spray Place 2 sprays into both nostrils daily. 16 g 6  ? PARoxetine (PAXIL) 40 MG tablet Take 1 tablet (40 mg total) by mouth every morning. 90 tablet 3  ? simvastatin (ZOCOR) 40 MG tablet Take 0.5 tablets (20 mg total) by mouth at bedtime. 90 tablet 3  ? traZODone (DESYREL) 50 MG tablet Take 0.5-1 tablets (25-50 mg total) by mouth at bedtime as needed for sleep. 90 tablet 3  ? valACYclovir (VALTREX) 1000 MG tablet Take 2 tabs twice a day for 1 day for cold sores. 30 tablet 6  ? ?No facility-administered medications prior to visit.  ? ? ?Review of Systems  ?Constitutional: Negative.   ?HENT: Negative.    ?Eyes: Negative.   ?Respiratory: Negative.    ?Cardiovascular: Negative.   ?Gastrointestinal: Negative.   ?Genitourinary: Negative.   ?Musculoskeletal:  Positive for arthralgias.  ?Skin: Negative.   ?Neurological: Negative.   ?Psychiatric/Behavioral: Negative.    ?All other systems reviewed and are negative. ? ?  ?Objective:  ?  ? ?BP (!) 142/82   Pulse 84   Temp 97.6 ?F  (36.4 ?C) (Temporal)   Resp 18   Ht 5\' 10"  (1.778 m)   Wt 235 lb (106.6 kg)   SpO2 97%   BMI 33.72 kg/m?  ? ?Wt Readings from Last 3 Encounters:  ?08/12/21 235 lb (106.6 kg)  ?07/04/20 241 lb (109.3 kg)  ?10/24/19 247 lb (112 kg)  ? ?Physical Exam ?Vitals and nursing note reviewed.  ?Constitutional:   ?   General: She is not in acute distress. ?   Appearance: Normal appearance. She is normal weight. She is not ill-appearing, toxic-appearing or diaphoretic.  ?Cardiovascular:  ?   Rate and Rhythm: Normal rate and regular rhythm.  ?   Heart sounds: Normal heart sounds. No murmur heard. ?  No friction rub. No gallop.  ?Pulmonary:  ?   Effort: Pulmonary effort is normal. No respiratory distress.  ?   Breath sounds: Normal breath sounds. No stridor. No wheezing, rhonchi or rales.  ?Chest:  ?   Chest wall: No tenderness.  ?Musculoskeletal:     ?   General: Tenderness (r medial epicondyle) present. No swelling, deformity or signs of injury. Normal range of motion.  ?   Right lower leg: No edema.  ?   Left lower leg: No edema.  ?Skin: ?   General: Skin is warm and dry.  ?Neurological:  ?  General: No focal deficit present.  ?   Mental Status: She is alert and oriented to person, place, and time. Mental status is at baseline.  ?Psychiatric:     ?   Mood and Affect: Mood normal.     ?   Behavior: Behavior normal.     ?   Thought Content: Thought content normal.     ?   Judgment: Judgment normal.  ? ? ?No results found for any visits on 08/12/21. ? ? ? ?The 10-year ASCVD risk score (Arnett DK, et al., 2019) is: 7.4% ? ?  ?Assessment & Plan:  ? ?Problem List Items Addressed This Visit   ?None ?Visit Diagnoses   ? ? Medial epicondylitis of right elbow    -  Primary  ? Relevant Medications  ? diclofenac (VOLTAREN) 75 MG EC tablet  ? ?  ? ? ?Meds ordered this encounter  ?Medications  ? diclofenac (VOLTAREN) 75 MG EC tablet  ?  Sig: Take 1 tablet (75 mg total) by mouth 2 (two) times daily.  ?  Dispense:  30 tablet  ?  Refill:   0  ?  Order Specific Question:   Supervising Provider  ?  Answer:   Neva Seat, JEFFREY R [[id number]]  ? ? ?Return if symptoms worsen or fail to improve.  ? ?PLAN ?Overuse leading to medial epicondylitis of R elbow ?RICE method discussed. Diclofenac po bid prn for pain ?If worsening or failing to improve, consider PT or ortho ?Patient encouraged to call clinic with any questions, comments, or concerns. ? ? ?Janeece Agee, NP ?

## 2021-08-12 NOTE — Patient Instructions (Addendum)
Ms. Hesseltine -  ? ?Great to see you! ? ?I think a simple overuse injury here - rest, ice, diclofenac, tylenol, sleep in a neutral position and I think we'll see things get back to normal within a couple of weeks.  ?Listen to your body and see how it does ? ?Let me know if it persists, worsens, or fails to improve ? ?Thanks, ? ?Rich  ? ? ? ?If you have lab work done today you will be contacted with your lab results within the next 2 weeks.  If you have not heard from [place] then please contact us. The fastest way to get your results is to register for My Chart. ? ? ?IF you received an x-ray today, you will receive an invoice from [facility]. Please contact [facility] at [phone] with questions or concerns regarding your invoice.  ? ?IF you received labwork today, you will receive an invoice from [facility]. Please contact [facility] at [phone] with questions or concerns regarding your invoice.  ? ?Our billing staff will not be able to assist you with questions regarding bills from these companies. ? ?You will be contacted with the lab results as soon as they are available. The fastest way to get your results is to activate your My Chart account. Instructions are located on the last page of this paperwork. If you have not heard from [place] regarding the results in 2 weeks, please contact this office. ?  ? ? ?

## 2022-01-15 ENCOUNTER — Telehealth: Payer: Self-pay | Admitting: Registered Nurse

## 2022-01-15 ENCOUNTER — Telehealth (INDEPENDENT_AMBULATORY_CARE_PROVIDER_SITE_OTHER): Payer: Managed Care, Other (non HMO) | Admitting: Family Medicine

## 2022-01-15 ENCOUNTER — Encounter: Payer: Self-pay | Admitting: Family Medicine

## 2022-01-15 VITALS — Ht 70.0 in

## 2022-01-15 DIAGNOSIS — K047 Periapical abscess without sinus: Secondary | ICD-10-CM | POA: Diagnosis not present

## 2022-01-15 MED ORDER — AMOXICILLIN-POT CLAVULANATE 875-125 MG PO TABS: 1.0000 | ORAL_TABLET | Freq: Two times a day (BID) | ORAL | 0 refills | Status: AC

## 2022-01-15 NOTE — Telephone Encounter (Signed)
Caller name: Charnee  On DPR? :yes/no: Yes  Call back number: [phone]  Provider they see: Orland Mustard  Reason for call: pt called stating that she has an impacted tooth; has appointment with dentist tomorrow but they recommended that she call PCP to get an antibiotic before that appointment.   [facility] [phone] If able to call something in

## 2022-01-15 NOTE — Telephone Encounter (Signed)
So the pt did schedule an apt ?

## 2022-01-15 NOTE — Telephone Encounter (Signed)
PT called back to get up date on about medication for her impacted tooth. Pt states that the left side of lower jaw line is swollen, hurtful to touch and gum are in flame. Pain started Sunday morning and the swollen has moved all the way up the left jaw line. Pt is very concern about the infection tooth.

## 2022-01-15 NOTE — Progress Notes (Signed)
Virtual Visit via Video Note I connected with Meredith Figueroa on 01/15/22 by a video enabled telemedicine application and verified that I am speaking with the correct person using two identifiers.  Location patient: home Location provider:work office Persons participating in the virtual visit: patient, provider  I discussed the limitations of evaluation and management by telemedicine and the availability of in person appointments. The patient expressed understanding and agreed to proceed.  Chief Complaint  Patient presents with   Facial Swelling   HPI: [name] is a 51 yo female with hx of vitamin D deficiency, anxiety, HLD,and hypertension complaining of  3 days of edema and tenderness of left lower gum and left mandibular area, it is getting worse. Left lower tooth (molar) moderate pain, which she has had intermittently for a couple weeks. "Impacted tooth", "cracked."  No hx of trauma. She has not noted any skin erythema/rash. Toothache is throbbing, worse with chewing and at night when she is in bed. She is taking Ibuprofen and applying Orajel, pain has improved some. Negative for fever, chills, stridor, earache,cough, wheezing, or dyspnea. Throat "irritation." She has an appt with oral surgeon tomorrow.  ROS: See pertinent positives and negatives per HPI.  Past Medical History:  Diagnosis Date   Allergy    Anal skin tag 02/13/2015   Anxiety    Essential hypertension 04/22/2019   HSV infection    twice annually; mouth   Past Surgical History:  Procedure Laterality Date   ABLATION     TUBAL LIGATION  08/2011   Family History  Problem Relation Age of Onset   Diabetes Mother    Arthritis Mother    Heart disease Mother    Cancer Father    Social History   Socioeconomic History   Marital status: Divorced    Spouse name: Lari Linson   Number of children: 1   Years of education: 14   Highest education level: Not on file  Occupational History   Occupation: Surveyor, minerals  Tobacco Use   Smoking status: Every Day    Packs/day: 1.00    Years: 30.00    Total pack years: 30.00    Types: Cigarettes    Start date: 01/07/1986   Smokeless tobacco: Never  Substance and Sexual Activity   Alcohol use: Not Currently    Comment: occasional   Drug use: No   Sexual activity: Yes    Partners: Male    Birth control/protection: Surgical  Other Topics Concern   Not on file  Social History Narrative   Divorced, 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 3 caffeinated beverages daily   02/13/2015   Social Determinants of Health   Financial Resource Strain: Not on file  Food Insecurity: Not on file  Transportation Needs: Not on file  Physical Activity: Not on file  Stress: Not on file  Social Connections: Not on file  Intimate Partner Violence: Not on file  Current Outpatient Medications:    ALPRAZolam (XANAX) 0.25 MG tablet, Take 1 tablet (0.25 mg total) by mouth 2 (two) times daily as needed for anxiety., Disp: 60 tablet, Rfl: 0   diclofenac (VOLTAREN) 75 MG EC tablet, Take 1 tablet (75 mg total) by mouth 2 (two) times daily., Disp: 30 tablet, Rfl: 0   fluticasone (FLONASE) 50 MCG/ACT nasal spray, Place 2 sprays into both nostrils daily., Disp: 16 g, Rfl: 6   PARoxetine (PAXIL) 40 MG tablet, Take 1 tablet (40 mg total) by mouth every morning., Disp:  90 tablet, Rfl: 3   simvastatin (ZOCOR) 40 MG tablet, Take 0.5 tablets (20 mg total) by mouth at bedtime., Disp: 90 tablet, Rfl: 3   traZODone (DESYREL) 50 MG tablet, Take 0.5-1 tablets (25-50 mg total) by mouth at bedtime as needed for sleep., Disp: 90 tablet, Rfl: 3   valACYclovir (VALTREX) 1000 MG tablet, Take 2 tabs twice a day for 1 day for cold sores., Disp: 30 tablet, Rfl: 6  EXAM:  VITALS per patient if applicable:Ht 5\' 10"  (1.778 m)   BMI 33.72 kg/m   GENERAL: alert, oriented, appears well and in no acute distress  HEENT: atraumatic, conjunctiva clear, no obvious abnormalities on  inspection of external nose and ears. Mild edema of left side above mandible, I do not appreciate erythema.  NECK: normal movements of the head and neck  LUNGS: on inspection no signs of respiratory distress, breathing rate appears normal, no obvious gross SOB, gasping or wheezing  CV: no obvious cyanosis  MS: moves all visible extremities without noticeable abnormality  PSYCH/NEURO: pleasant and cooperative, no obvious depression, + anxious. Speech and thought processing grossly intact  ASSESSMENT AND PLAN:  Discussed the following assessment and plan:  Dental abscess - Plan: amoxicillin-clavulanate (AUGMENTIN) 875-125 MG tablet We discussed differential diagnosis. Recommend starting Augmentin tonight, side effects reviewed, she could take daily daily probiotic to decrease the risk of GI symptoms. Pain has mildly improved, continue ibuprofen and topical Orajel.  We discussed side effects of NSAIDs given her history of hypertension. Stressed the importance of keeping appointment with oral surgeon tomorrow. Instructed about warning signs.   We discussed possible serious and likely etiologies, options for evaluation and workup, limitations of telemedicine visit vs in person visit, treatment, treatment risks and precautions. The patient was advised to call back or seek an in-person evaluation if the symptoms worsen or if the condition fails to improve as anticipated. I discussed the assessment and treatment plan with the patient. The patient was provided an opportunity to ask questions and all were answered. The patient agreed with the plan and demonstrated an understanding of the instructions.  Osei Anger G. , MD  [facility]. [place] office.

## 2022-01-15 NOTE — Telephone Encounter (Signed)
Pt has MyChart appt with MD Betty Martinique today

## 2022-01-16 NOTE — Telephone Encounter (Signed)
Yes

## 2022-07-01 ENCOUNTER — Ambulatory Visit: Payer: Managed Care, Other (non HMO) | Admitting: Podiatry

## 2023-12-21 ENCOUNTER — Telehealth: Payer: Self-pay

## 2023-12-21 NOTE — Telephone Encounter (Signed)
 Copied from CRM #[id number]. Topic: Complaint (DO NOT CONVERT) - Provider (non-sensitive) >> Dec 21, 2023 10:49 AM Rosina BIRCH wrote: Patient/patient representative is calling for information regarding an appointment.   patient called seeking an acute appointment and she was unaware she had a TOC appointment. She became frustrated and stated she wanted to cancel all her appointments. Please follow up with patient directly >> Dec 21, 2023 11:31 AM Rosina D wrote: Acute symptoms/possible UTI

## 2023-12-23 ENCOUNTER — Ambulatory Visit: Admitting: Family Medicine

## 2023-12-23 ENCOUNTER — Encounter: Payer: Self-pay | Admitting: Family Medicine

## 2023-12-23 VITALS — BP 126/88 | HR 79 | Temp 98.3°F | Ht 70.0 in | Wt 238.0 lb

## 2023-12-23 DIAGNOSIS — R300 Dysuria: Secondary | ICD-10-CM | POA: Diagnosis not present

## 2023-12-23 DIAGNOSIS — R1030 Lower abdominal pain, unspecified: Secondary | ICD-10-CM | POA: Diagnosis not present

## 2023-12-23 DIAGNOSIS — Z7689 Persons encountering health services in other specified circumstances: Secondary | ICD-10-CM

## 2023-12-23 DIAGNOSIS — R319 Hematuria, unspecified: Secondary | ICD-10-CM | POA: Diagnosis not present

## 2023-12-23 DIAGNOSIS — E669 Obesity, unspecified: Secondary | ICD-10-CM

## 2023-12-23 LAB — LIPID PANEL
Cholesterol: 269 mg/dL — ABNORMAL HIGH (ref 0–200)
HDL: 48.2 mg/dL (ref >39.00)
LDL Cholesterol: 198 mg/dL — ABNORMAL HIGH (ref 0–99)
NonHDL: 221.1
Total CHOL/HDL Ratio: 6
Triglycerides: 114 mg/dL (ref 0.0–149.0)
VLDL: 22.8 mg/dL (ref 0.0–40.0)

## 2023-12-23 LAB — COMPREHENSIVE METABOLIC PANEL WITH GFR
ALT: 21 U/L (ref 0–35)
AST: 15 U/L (ref 0–37)
Albumin: 4.3 g/dL (ref 3.5–5.2)
Alkaline Phosphatase: 124 U/L — ABNORMAL HIGH (ref 39–117)
BUN: 11 mg/dL (ref 6–23)
CO2: 24 meq/L (ref 19–32)
Calcium: 9.2 mg/dL (ref 8.4–10.5)
Chloride: 105 meq/L (ref 96–112)
Creatinine, Ser: 0.87 mg/dL (ref 0.40–1.20)
GFR: 75.17 mL/min (ref >60.00)
Glucose, Bld: 105 mg/dL — ABNORMAL HIGH (ref 70–99)
Potassium: 4 meq/L (ref 3.5–5.1)
Sodium: 137 meq/L (ref 135–145)
Total Bilirubin: 0.6 mg/dL (ref 0.2–1.2)
Total Protein: 7.4 g/dL (ref 6.0–8.3)

## 2023-12-23 LAB — CBC WITH DIFFERENTIAL/PLATELET
Basophils Absolute: 0 K/uL (ref 0.0–0.1)
Basophils Relative: 0.3 % (ref 0.0–3.0)
Eosinophils Absolute: 0.1 K/uL (ref 0.0–0.7)
Eosinophils Relative: 1.4 % (ref 0.0–5.0)
HCT: 47.8 % — ABNORMAL HIGH (ref 36.0–46.0)
Hemoglobin: 16.2 g/dL — ABNORMAL HIGH (ref 12.0–15.0)
Lymphocytes Relative: 25.7 % (ref 12.0–46.0)
Lymphs Abs: 2.5 K/uL (ref 0.7–4.0)
MCHC: 33.9 g/dL (ref 30.0–36.0)
MCV: 94 fl (ref 78.0–100.0)
Monocytes Absolute: 0.8 K/uL (ref 0.1–1.0)
Monocytes Relative: 8 % (ref 3.0–12.0)
Neutro Abs: 6.4 K/uL (ref 1.4–7.7)
Neutrophils Relative %: 64.6 % (ref 43.0–77.0)
Platelets: 221 K/uL (ref 150.0–400.0)
RBC: 5.09 Mil/uL (ref 3.87–5.11)
RDW: 14.8 % (ref 11.5–15.5)
WBC: 9.8 K/uL (ref 4.0–10.5)

## 2023-12-23 LAB — POCT URINALYSIS DIPSTICK
Bilirubin, UA: NEGATIVE
Blood, UA: POSITIVE
Glucose, UA: NEGATIVE
Ketones, UA: NEGATIVE
Leukocytes, UA: NEGATIVE
Nitrite, UA: NEGATIVE
Protein, UA: NEGATIVE
Spec Grav, UA: 1.01 (ref 1.010–1.025)
Urobilinogen, UA: 0.2 U/dL
pH, UA: 7 (ref 5.0–8.0)

## 2023-12-23 LAB — TSH: TSH: 1.05 u[IU]/mL (ref 0.35–5.50)

## 2023-12-23 LAB — LIPASE: Lipase: 22 U/L (ref 11.0–59.0)

## 2023-12-23 LAB — HEMOGLOBIN A1C: Hgb A1c MFr Bld: 6.1 % (ref 4.6–6.5)

## 2023-12-23 NOTE — Patient Instructions (Addendum)
-  It was nice to meet you and look forward to taking care of you. -Urinalysis completed. Blood found in urine. Will send urine for culture. If culture is positive, will treat. If urine is negative, will send to [facility] for hematuria.  -Ordered CBC, CMP, Lipase, Lipid panel, A1c, and TSH) based on abdominal pain and based on BMI-obesity. Office will call with results and will be available on MyChart.  -Follow up in 1 year for a physical.

## 2023-12-23 NOTE — Progress Notes (Signed)
 New Patient Office Visit  Subjective   Patient ID: Meredith Figueroa, female    DOB: 1970/06/02  Age: 53 y.o. MRN: [id number]  CC:  Chief Complaint  Patient presents with   Establish Care   Urinary Frequency    HPI [name] presents to establish care with new provider.  Patients previous primary care provider was [facility] with [employer], NP. Last seen in 08/2021.   Specialist: None  Patient reports she is having complaining of lower abd pain, bloating, cloudy urine, and increase in urgency and frequency. She reports symptoms started 08/22. She was seen at [facility] on 08/30 for same symptoms. Urinalysis showed small amount of blood and leukocytes. Culture came back negative. She was prescribed Macrobid and Pyridium. She reports she took 5 days worth of Macrobid before stopping medication based on culture results.   Denies fever, generally not feeling well, and lower back pain.  Denies any vaginal bleeding or odor.  Reported nausea with no vomiting.  Outpatient Encounter Medications as of 12/23/2023  Medication Sig   [DISCONTINUED] fluorouracil (EFUDEX) 5 % cream Apply topically daily.   [DISCONTINUED] fluticasone  (FLONASE ) 50 MCG/ACT nasal spray Place 2 sprays into both nostrils daily.   [DISCONTINUED] PARoxetine  (PAXIL ) 40 MG tablet Take 1 tablet (40 mg total) by mouth every morning.   [DISCONTINUED] valACYclovir  (VALTREX ) 1000 MG tablet Take 2 tabs twice a day for 1 day for cold sores.   [DISCONTINUED] ALPRAZolam  (XANAX ) 0.25 MG tablet Take 1 tablet (0.25 mg total) by mouth 2 (two) times daily as needed for anxiety.   [DISCONTINUED] diclofenac  (VOLTAREN ) 75 MG EC tablet Take 1 tablet (75 mg total) by mouth 2 (two) times daily.   [DISCONTINUED] simvastatin  (ZOCOR ) 40 MG tablet Take 0.5 tablets (20 mg total) by mouth at bedtime.   [DISCONTINUED] traZODone  (DESYREL ) 50 MG  tablet Take 0.5-1 tablets (25-50 mg total) by mouth at bedtime as needed for sleep.   No facility-administered encounter medications on file as of 12/23/2023.    Past Medical History:  Diagnosis Date   Allergy    Anal skin tag 02/13/2015   Anxiety    Essential hypertension 04/22/2019   HSV infection    twice annually; mouth    Past Surgical History:  Procedure Laterality Date   ABLATION     TUBAL LIGATION  08/2011    Family History  Problem Relation Age of Onset   Diabetes Mother    Arthritis Mother    Heart disease Mother    Cancer Father        Throat    Social History   Socioeconomic History   Marital status: Divorced    Spouse name: Terissa Haffey   Number of children: 1   Years of education: 14   Highest education level: Some college, no degree  Occupational History   Occupation: Marine scientist  Tobacco Use   Smoking status: Every Day    Current packs/day: 1.00    Average packs/day: 1 pack/day for 38.0 years (38.0 ttl pk-yrs)    Types: Cigarettes    Start date: 01/07/1986   Smokeless tobacco: Never  Vaping Use   Vaping status: Never Used  Substance and Sexual Activity   Alcohol use: Not Currently   Drug use: No   Sexual activity: Yes    Partners: Male    Birth control/protection: Surgical  Other Topics Concern   Not on file  Social  History Narrative      Social Drivers of Corporate investment banker Strain: Low Risk  (12/23/2023)   Overall Financial Resource Strain (CARDIA)    Difficulty of Paying Living Expenses: Not hard at all  Food Insecurity: No Food Insecurity (12/23/2023)   Hunger Vital Sign    Worried About Running Out of Food in the Last Year: Never true    Ran Out of Food in the Last Year: Never true  Transportation Needs: No Transportation Needs (12/23/2023)   PRAPARE - Administrator, Civil Service (Medical): No    Lack of Transportation (Non-Medical): No  Physical Activity: Insufficiently Active (12/23/2023)   Exercise  Vital Sign    Days of Exercise per Week: 4 days    Minutes of Exercise per Session: 30 min  Stress: No Stress Concern Present (12/23/2023)   [employer] of Occupational Health - Occupational Stress Questionnaire    Feeling of Stress: Only a little  Social Connections: Moderately Integrated (12/23/2023)   Social Connection and Isolation Panel    Frequency of Communication with Friends and Family: More than three times a week    Frequency of Social Gatherings with Friends and Family: Twice a week    Attends Religious Services: More than 4 times per year    Active Member of [employer] or Organizations: No    Attends Banker Meetings: Never    Marital Status: Married  Catering manager Violence: Not At Risk (12/23/2023)   Humiliation, Afraid, Rape, and Kick questionnaire    Fear of Current or Ex-Partner: No    Emotionally Abused: No    Physically Abused: No    Sexually Abused: No    ROS See HPI above    Objective  BP 126/88   Pulse 79   Temp 98.3 F (36.8 C) (Oral)   Ht 5' 10 (1.778 m)   Wt 238 lb (108 kg)   SpO2 94%   BMI 34.15 kg/m   Physical Exam Vitals reviewed.  Constitutional:      General: She is not in acute distress.    Appearance: Normal appearance. She is obese. She is not ill-appearing, toxic-appearing or diaphoretic.  HENT:     Head: Normocephalic and atraumatic.  Eyes:     General:        Right eye: No discharge.        Left eye: No discharge.     Conjunctiva/sclera: Conjunctivae normal.  Cardiovascular:     Rate and Rhythm: Normal rate and regular rhythm.     Heart sounds: Normal heart sounds. No murmur heard.    No friction rub. No gallop.  Pulmonary:     Effort: Pulmonary effort is normal. No respiratory distress.     Breath sounds: Normal breath sounds.  Abdominal:     General: Abdomen is flat. Bowel sounds are normal. There is no distension.     Palpations: Abdomen is soft. There is no mass.     Tenderness: There is abdominal  tenderness (Right lower and mid). There is no right CVA tenderness or left CVA tenderness.  Musculoskeletal:        General: Normal range of motion.  Skin:    General: Skin is warm and dry.  Neurological:     General: No focal deficit present.     Mental Status: She is alert and oriented to person, place, and time. Mental status is at baseline.  Psychiatric:        Mood and Affect:  Mood normal.        Behavior: Behavior normal.        Thought Content: Thought content normal.        Judgment: Judgment normal.     Latest Reference Range & Units Most Recent  Bilirubin, UA  neg 12/23/23 11:40  Clarity, UA  clear 12/23/23 11:40  Color, UA  yellow 12/23/23 11:40  Glucose Negative  Negative 12/23/23 11:40  Ketones, UA  neg 12/23/23 11:40  Leukocytes,UA Negative  Negative 12/23/23 11:40  Nitrite, UA  neg 12/23/23 11:40  pH, UA 5.0 - 8.0  7.0 12/23/23 11:40  Protein,UA Negative  Negative 12/23/23 11:40  Specific Gravity, UA 1.010 - 1.025  1.010 12/23/23 11:40  Urobilinogen, UA 0.2 or 1.0 E.U./dL 0.2 0/89/74 88:59  RBC, UA  positive 12/23/23 11:40      Assessment & Plan:  Hematuria, unspecified type -     POCT urinalysis dipstick -     Urine Culture  Dysuria -     POCT urinalysis dipstick -     Urine Culture  Lower abdominal pain -     CBC with Differential/Platelet -     Comprehensive metabolic panel with GFR -     Lipase  Obesity (BMI 30-39.9) -     CBC with Differential/Platelet -     Comprehensive metabolic panel with GFR -     Lipid panel -     Hemoglobin A1c -     TSH  Encounter to establish care  1.Review health maintenance:  -Cervical cancer screening: 2023; [facility]  -Colonoscopy: Declines  -Covid booster: Declines  -Tdap vaccine: Declines  -Hep B vaccine: Declines  -HIV and Hep C screening: Declines  -Influenza vaccine: Declines  -CT Lung Cancer screening: Declines  -Mammogram: Declines  -PNA vaccine: Declines -Zoster vaccine: Declines.   2.Urinalysis completed. Hematuria positive. Will send urine for culture. If culture is positive, will treat. If urine is negative, will send to [facility] for hematuria.  3.Ordered CBC, CMP, Lipase, Lipid panel, A1c, and TSH) based on abdominal pain and based on BMI-obesity. Office will call with results and will be available on MyChart.  Return in about 1 year (around 12/22/2024) for physical.   Cady Hafen, NP

## 2023-12-24 ENCOUNTER — Ambulatory Visit: Payer: Self-pay | Admitting: Family Medicine

## 2023-12-24 DIAGNOSIS — D582 Other hemoglobinopathies: Secondary | ICD-10-CM

## 2023-12-24 DIAGNOSIS — E785 Hyperlipidemia, unspecified: Secondary | ICD-10-CM

## 2023-12-24 DIAGNOSIS — R319 Hematuria, unspecified: Secondary | ICD-10-CM

## 2023-12-24 LAB — URINE CULTURE
MICRO NUMBER:: 16949066
SPECIMEN QUALITY:: ADEQUATE

## 2023-12-28 NOTE — Addendum Note (Signed)
 Addended by: ELNER NANNY B on: 12/28/2023 05:13 PM   Modules accepted: Orders

## 2023-12-29 MED ORDER — ROSUVASTATIN CALCIUM 10 MG PO TABS: 10.0000 mg | ORAL_TABLET | Freq: Every day | ORAL | 1 refills | Status: DC

## 2023-12-29 MED ORDER — FLUTICASONE PROPIONATE 50 MCG/ACT NA SUSP: 2.0000 | Freq: Every day | NASAL | 2 refills | Status: DC

## 2023-12-29 NOTE — Addendum Note (Signed)
 Addended by: ELNER NANNY B on: 12/29/2023 04:55 PM   Modules accepted: Orders

## 2023-12-30 NOTE — Telephone Encounter (Signed)
 Looks like she already had an appt with [facility] at [facility] and that is her PCP

## 2024-01-01 ENCOUNTER — Encounter: Admitting: Family Medicine

## 2024-01-08 ENCOUNTER — Other Ambulatory Visit: Payer: Self-pay

## 2024-01-08 DIAGNOSIS — R319 Hematuria, unspecified: Secondary | ICD-10-CM

## 2024-01-31 ENCOUNTER — Other Ambulatory Visit: Payer: Self-pay | Admitting: Family Medicine

## 2024-01-31 DIAGNOSIS — E785 Hyperlipidemia, unspecified: Secondary | ICD-10-CM

## 2024-02-08 ENCOUNTER — Encounter: Admitting: Family Medicine

## 2024-03-29 ENCOUNTER — Other Ambulatory Visit: Payer: Self-pay

## 2024-03-29 ENCOUNTER — Telehealth: Payer: Self-pay | Admitting: *Deleted

## 2024-03-29 DIAGNOSIS — E785 Hyperlipidemia, unspecified: Secondary | ICD-10-CM

## 2024-03-29 MED ORDER — ROSUVASTATIN CALCIUM 10 MG PO TABS: 10.0000 mg | ORAL_TABLET | Freq: Every day | ORAL | 3 refills | Status: AC

## 2024-03-29 NOTE — Telephone Encounter (Signed)
 Copied from CRM #[id number]. Topic: Referral - Question >> Mar 29, 2024 11:52 AM Roselie BROCKS wrote: Reason for CRM: Patient states she has a referral to a urologist but the urology dept said its showing it from [facility] and not [facility] Patient requests a return call about this . Also patient was prescribed cholesterol meds and needs to speak about that .

## 2024-03-29 NOTE — Telephone Encounter (Signed)
 Spoke to pt and answered all pt concerns and gave referral information.

## 2024-04-03 ENCOUNTER — Other Ambulatory Visit: Payer: Self-pay | Admitting: Family Medicine

## 2024-04-18 ENCOUNTER — Encounter: Admitting: Family Medicine

## 2024-04-29 ENCOUNTER — Other Ambulatory Visit: Payer: Self-pay | Admitting: Urology

## 2024-04-29 DIAGNOSIS — R350 Frequency of micturition: Secondary | ICD-10-CM

## 2024-04-29 DIAGNOSIS — R3129 Other microscopic hematuria: Secondary | ICD-10-CM

## 2024-04-29 DIAGNOSIS — R3915 Urgency of urination: Secondary | ICD-10-CM
